# Patient Record
Sex: Female | Born: 1985 | Race: White | Hispanic: No | Marital: Single
Health system: Southern US, Academic
[De-identification: ages and names within clinical notes are randomized; demographics above are authoritative.]

## PROBLEM LIST (undated history)

## (undated) ENCOUNTER — Telehealth

## (undated) ENCOUNTER — Telehealth
Attending: Student in an Organized Health Care Education/Training Program | Primary: Student in an Organized Health Care Education/Training Program

## (undated) ENCOUNTER — Telehealth: Attending: Psychiatry | Primary: Psychiatry

## (undated) ENCOUNTER — Encounter

## (undated) ENCOUNTER — Encounter: Attending: Psychiatry | Primary: Psychiatry

## (undated) ENCOUNTER — Ambulatory Visit: Payer: MEDICAID

## (undated) ENCOUNTER — Encounter
Attending: Student in an Organized Health Care Education/Training Program | Primary: Student in an Organized Health Care Education/Training Program

## (undated) ENCOUNTER — Ambulatory Visit
Payer: MEDICAID | Attending: Student in an Organized Health Care Education/Training Program | Primary: Student in an Organized Health Care Education/Training Program

## (undated) ENCOUNTER — Ambulatory Visit

## (undated) ENCOUNTER — Ambulatory Visit: Payer: MEDICAID | Attending: Clinical Neuropsychologist | Primary: Clinical Neuropsychologist

## (undated) ENCOUNTER — Encounter: Attending: Family | Primary: Family

## (undated) ENCOUNTER — Ambulatory Visit: Payer: MEDICAID | Attending: Obstetrics & Gynecology | Primary: Obstetrics & Gynecology

## (undated) ENCOUNTER — Telehealth: Attending: Clinical Neuropsychologist | Primary: Clinical Neuropsychologist

## (undated) ENCOUNTER — Ambulatory Visit: Payer: MEDICAID | Attending: Internal Medicine | Primary: Internal Medicine

## (undated) ENCOUNTER — Ambulatory Visit: Payer: MEDICAID | Attending: Family | Primary: Family

## (undated) ENCOUNTER — Telehealth: Attending: Internal Medicine | Primary: Internal Medicine

## (undated) ENCOUNTER — Encounter
Attending: Pharmacist Clinician (PhC)/ Clinical Pharmacy Specialist | Primary: Pharmacist Clinician (PhC)/ Clinical Pharmacy Specialist

## (undated) ENCOUNTER — Ambulatory Visit
Attending: Pharmacist Clinician (PhC)/ Clinical Pharmacy Specialist | Primary: Pharmacist Clinician (PhC)/ Clinical Pharmacy Specialist

## (undated) ENCOUNTER — Encounter: Payer: MEDICAID | Attending: Gastroenterology | Primary: Gastroenterology

## (undated) ENCOUNTER — Ambulatory Visit: Payer: MEDICAID | Attending: Nurse Practitioner | Primary: Nurse Practitioner

## (undated) ENCOUNTER — Ambulatory Visit: Attending: Social Worker | Primary: Social Worker

## (undated) ENCOUNTER — Ambulatory Visit: Payer: MEDICAID | Attending: Physician Assistant | Primary: Physician Assistant

---
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
## 2009-01-09 ENCOUNTER — Emergency Department (HOSPITAL_COMMUNITY): Admission: EM | Admit: 2009-01-09 | Discharge: 2009-01-09 | Payer: Self-pay | Admitting: Emergency Medicine

## 2010-01-08 ENCOUNTER — Emergency Department (HOSPITAL_COMMUNITY): Admission: EM | Admit: 2010-01-08 | Discharge: 2010-01-08 | Payer: Self-pay | Admitting: Emergency Medicine

## 2010-01-10 ENCOUNTER — Inpatient Hospital Stay (HOSPITAL_COMMUNITY): Admission: AD | Admit: 2010-01-10 | Discharge: 2010-01-10 | Payer: Self-pay | Admitting: Obstetrics & Gynecology

## 2010-01-14 ENCOUNTER — Inpatient Hospital Stay (HOSPITAL_COMMUNITY): Admission: AD | Admit: 2010-01-14 | Discharge: 2010-01-14 | Payer: Self-pay | Admitting: Family Medicine

## 2010-01-14 IMAGING — US US OB TRANSVAGINAL
1 series · 14 of 24 positions shown · non-contrast
Comparison: none

OBSTETRICAL ULTRASOUND:
 This ultrasound exam was performed in the [HOSPITAL] Ultrasound Department.  The OB US report was generated in the AS system, and faxed to the ordering physician.  This report is also available in [HOSPITAL]?s AccessANYware and in [REDACTED] PACS.

[Series 1: us ob transvaginal · 0.13mm/px · 24 acquisitions, 14 frames shown]
[im 1/24]
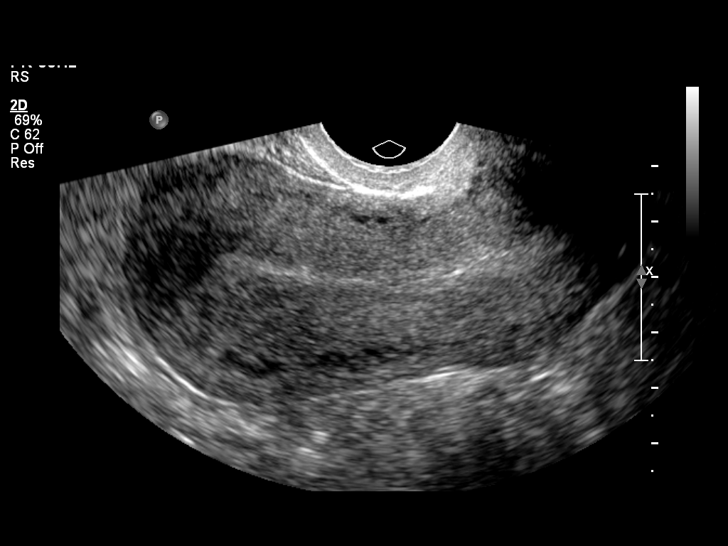
[im 3/24]
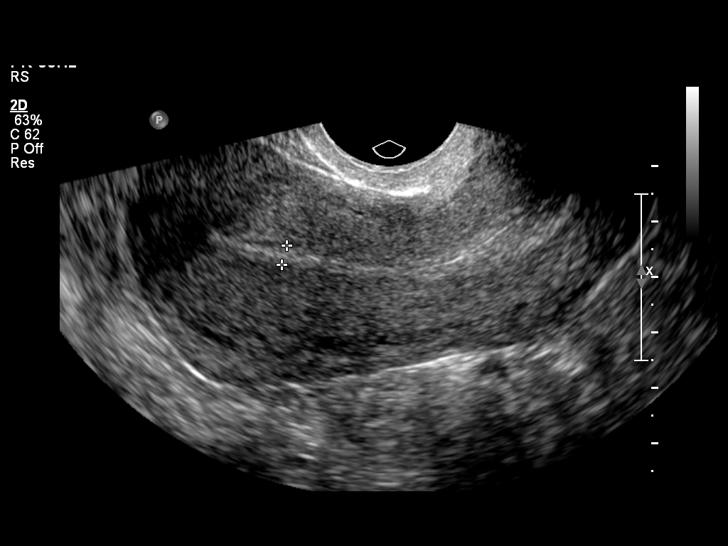
[im 5/24]
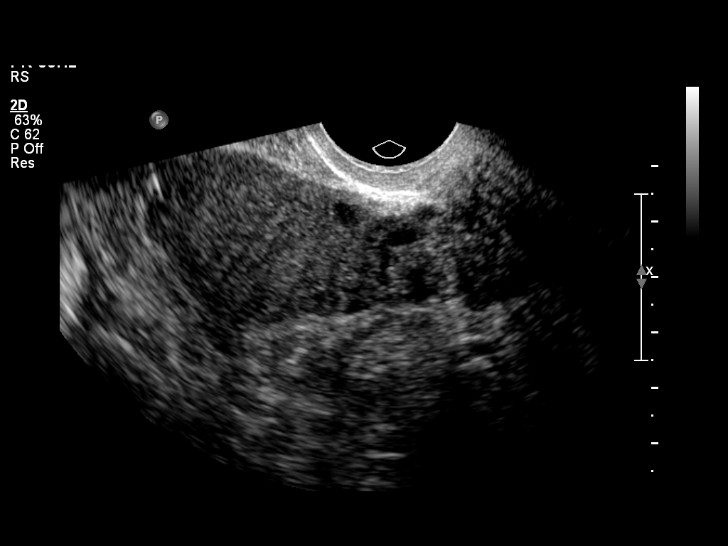
[im 7/24]
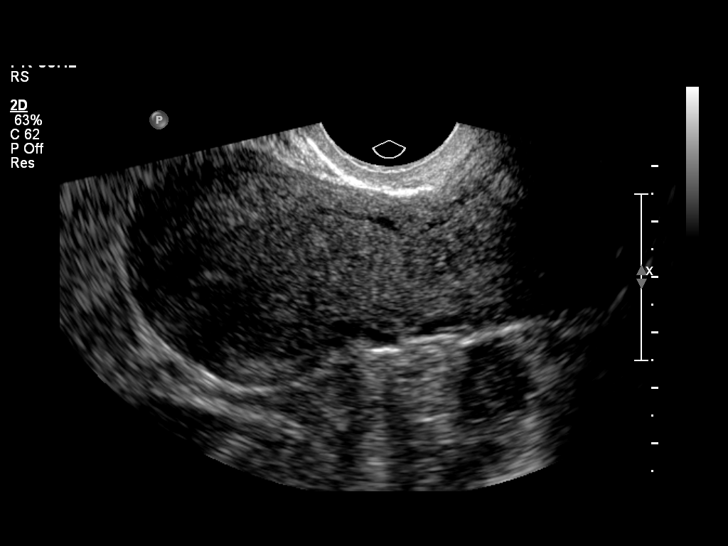
[im 8/24]
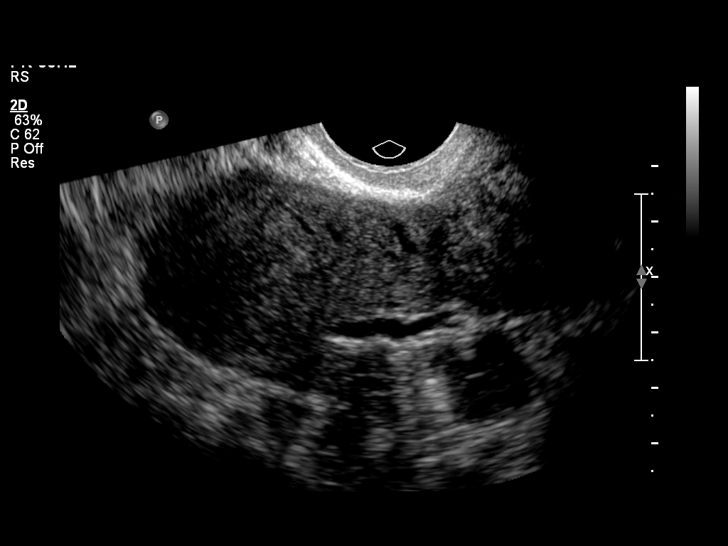
[im 10/24]
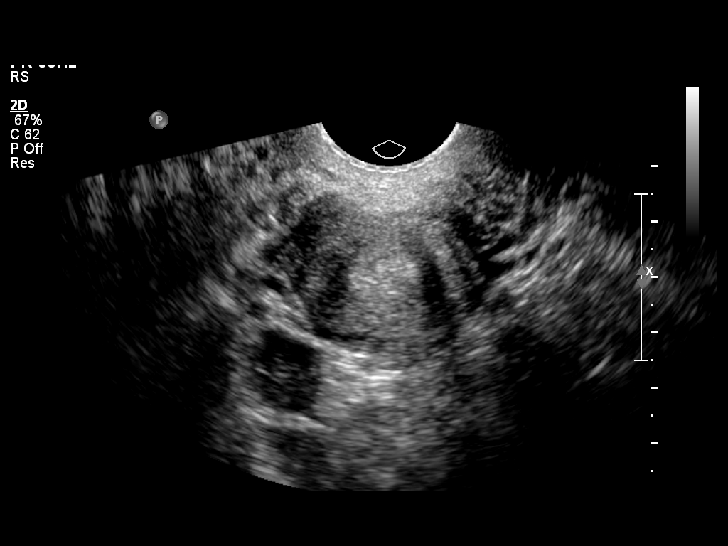
[im 12/24]
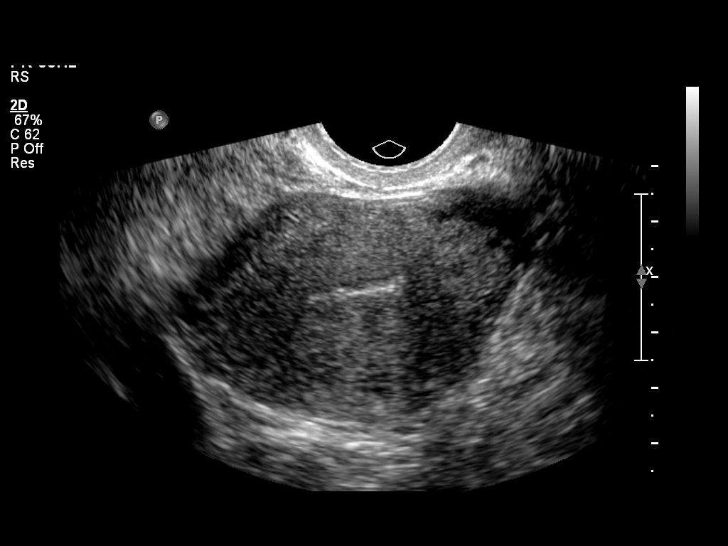
[im 13/24]
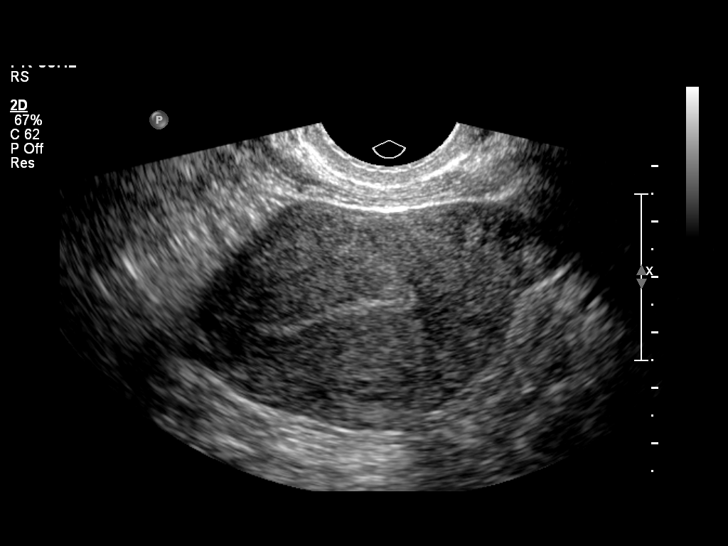
[im 15/24]
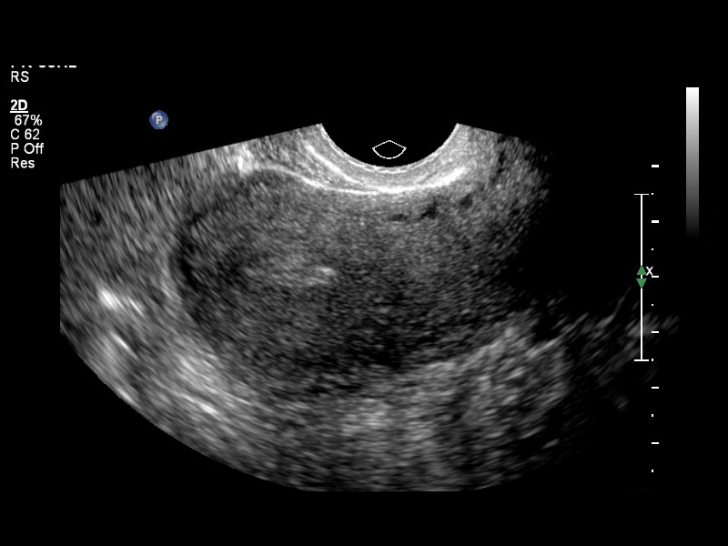
[im 17/24]
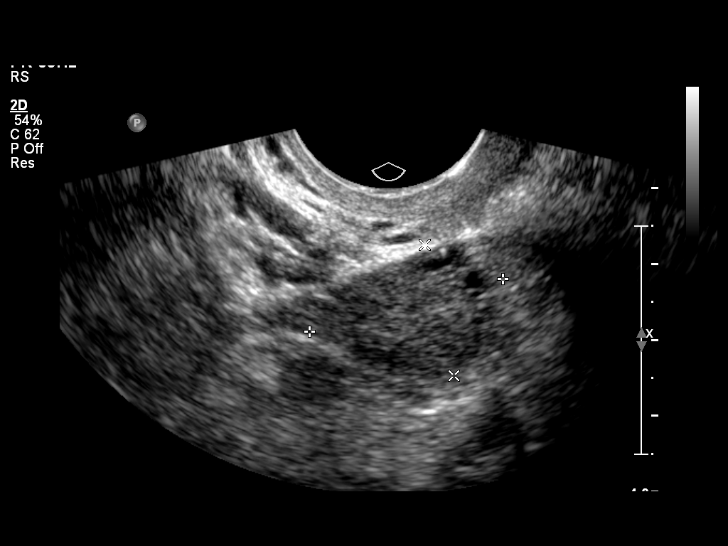
[im 19/24]
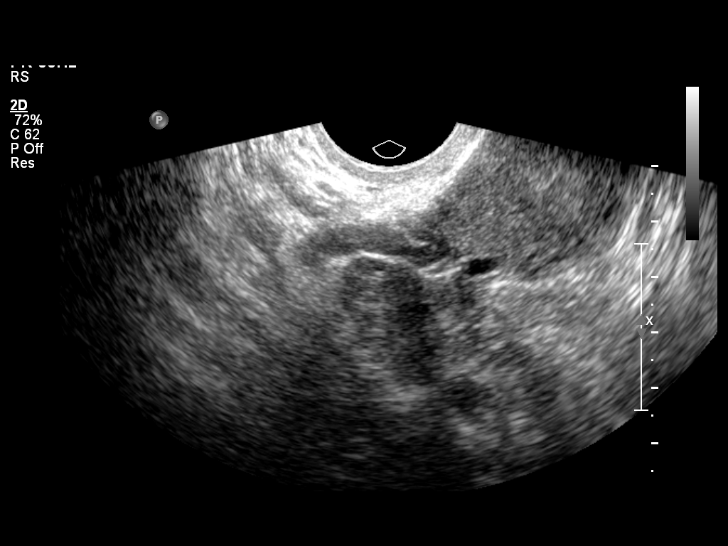
[im 20/24]
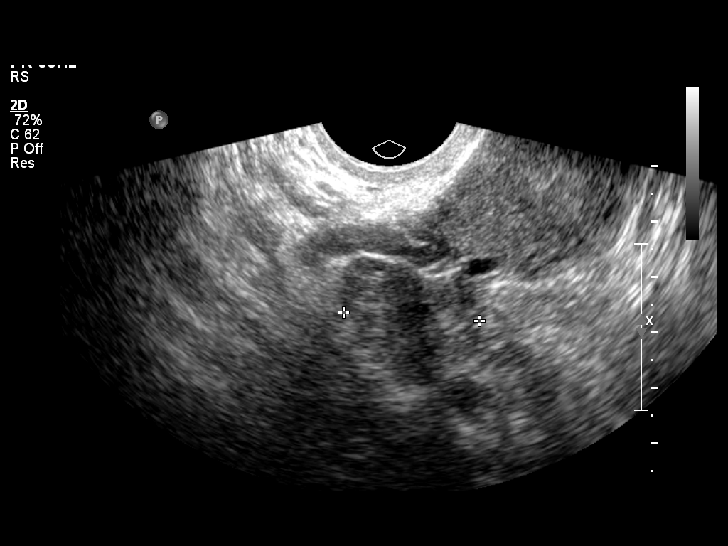
[im 22/24]
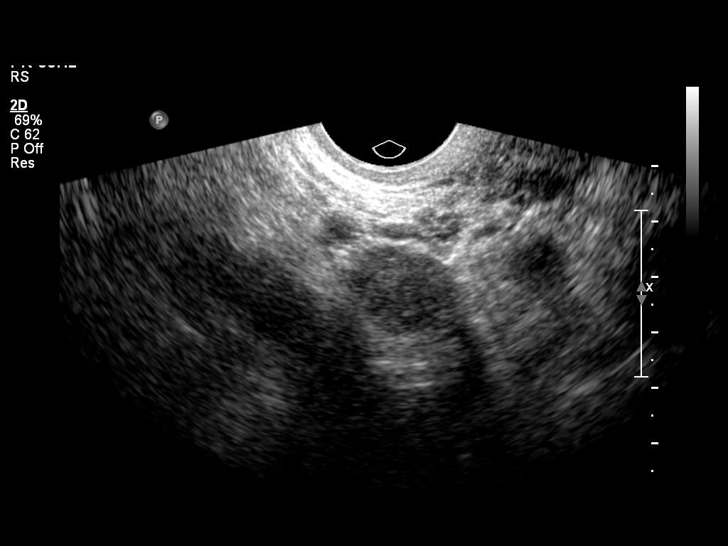
[im 24/24]
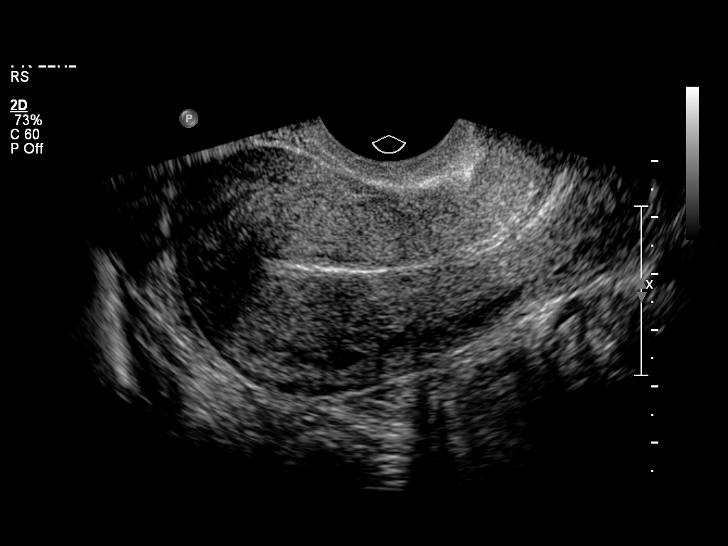

[14 of 24 positions shown; findings below may reference images not displayed]

IMPRESSION: See AS Obstetric US report.

## 2010-11-09 ENCOUNTER — Encounter: Payer: Self-pay | Admitting: Obstetrics & Gynecology

## 2011-01-12 LAB — CBC
HCT: 43.3 % (ref 36.0–46.0)
Hemoglobin: 14.6 g/dL (ref 12.0–15.0)
Platelets: 300 10*3/uL (ref 150–400)
RBC: 4.31 MIL/uL (ref 3.87–5.11)
RDW: 12.2 % (ref 11.5–15.5)
WBC: 8.6 10*3/uL (ref 4.0–10.5)

## 2011-01-12 LAB — POCT I-STAT, CHEM 8
Calcium, Ion: 1.22 mmol/L (ref 1.12–1.32)
Chloride: 108 mEq/L (ref 96–112)
Glucose, Bld: 96 mg/dL (ref 70–99)
Hemoglobin: 14.6 g/dL (ref 12.0–15.0)
Potassium: 4.1 mEq/L (ref 3.5–5.1)
TCO2: 23 mmol/L (ref 0–100)

## 2011-01-12 LAB — URINALYSIS, ROUTINE W REFLEX MICROSCOPIC
Bilirubin Urine: NEGATIVE
Glucose, UA: NEGATIVE mg/dL
Ketones, ur: NEGATIVE mg/dL
Protein, ur: NEGATIVE mg/dL
Urobilinogen, UA: 1 mg/dL (ref 0.0–1.0)
pH: 7.5 (ref 5.0–8.0)

## 2011-01-12 LAB — WET PREP, GENITAL: Trich, Wet Prep: NONE SEEN

## 2011-01-12 LAB — GC/CHLAMYDIA PROBE AMP, GENITAL
Chlamydia, DNA Probe: NEGATIVE
GC Probe Amp, Genital: NEGATIVE

## 2011-01-12 LAB — ABO/RH: ABO/RH(D): A POS

## 2017-04-29 ENCOUNTER — Ambulatory Visit
Admission: RE | Admit: 2017-04-29 | Discharge: 2017-04-29 | Disposition: A | Discharge status: Home / Self Care | Payer: MEDICAID | Attending: Family | Admitting: Family

## 2017-04-29 DIAGNOSIS — F319 Bipolar disorder, unspecified: Secondary | ICD-10-CM

## 2017-04-29 DIAGNOSIS — O99320 Drug use complicating pregnancy, unspecified trimester: Principal | ICD-10-CM

## 2017-04-29 DIAGNOSIS — F4310 Post-traumatic stress disorder, unspecified: Secondary | ICD-10-CM

## 2017-04-29 MED ORDER — BUPRENORPHINE 8 MG-NALOXONE 2 MG SUBLINGUAL FILM: 1 | Film | Freq: Three times a day (TID) | 0 refills | 0 days | Status: AC

## 2017-04-29 MED ORDER — METRONIDAZOLE 0.75 % VAGINAL GEL
Freq: Two times a day (BID) | VAGINAL | 2 refills | 0.00000 days | Status: CP
Start: 2017-04-29 — End: 2017-04-29

## 2017-04-29 MED ORDER — METRONIDAZOLE 0.75 % VAGINAL GEL: g | Freq: Two times a day (BID) | 2 refills | 0 days | Status: AC

## 2017-04-29 MED ORDER — TERCONAZOLE 0.4 % VAGINAL CREAM
Freq: Every evening | VAGINAL | 0 refills | 0.00000 days | Status: CP
Start: 2017-04-29 — End: 2017-05-06

## 2017-04-29 MED ORDER — TERCONAZOLE 0.4 % VAGINAL CREAM: 1 | g | Freq: Every evening | 0 refills | 0 days | Status: AC

## 2017-04-29 MED ORDER — BUPRENORPHINE 8 MG-NALOXONE 2 MG SUBLINGUAL FILM
ORAL_FILM | Freq: Three times a day (TID) | SUBLINGUAL | 0 refills | 0.00000 days | Status: CP
Start: 2017-04-29 — End: 2017-05-13

## 2017-05-13 MED ORDER — BUPRENORPHINE 8 MG-NALOXONE 2 MG SUBLINGUAL FILM
ORAL_FILM | Freq: Three times a day (TID) | SUBLINGUAL | 0 refills | 0.00000 days | Status: CP
Start: 2017-05-13 — End: 2017-05-27

## 2017-05-27 MED ORDER — BUPRENORPHINE 8 MG-NALOXONE 2 MG SUBLINGUAL FILM
ORAL_FILM | Freq: Three times a day (TID) | SUBLINGUAL | 0 refills | 0.00000 days | Status: CP
Start: 2017-05-27 — End: 2017-06-10

## 2017-06-11 MED ORDER — BUPRENORPHINE 8 MG-NALOXONE 2 MG SUBLINGUAL FILM
ORAL_FILM | Freq: Three times a day (TID) | SUBLINGUAL | 0 refills | 0.00000 days | Status: CP
Start: 2017-06-11 — End: 2017-06-24

## 2017-06-17 ENCOUNTER — Ambulatory Visit: Admission: RE | Admit: 2017-06-17 | Discharge: 2017-06-17 | Payer: MEDICAID

## 2017-06-17 DIAGNOSIS — F331 Major depressive disorder, recurrent, moderate: Principal | ICD-10-CM

## 2017-06-17 DIAGNOSIS — F1121 Opioid dependence, in remission: Secondary | ICD-10-CM

## 2017-06-17 MED ORDER — MIRTAZAPINE 15 MG TABLET
ORAL_TABLET | Freq: Every evening | ORAL | 0 refills | 0 days | Status: CP
Start: 2017-06-17 — End: 2017-07-08

## 2017-06-17 MED ORDER — LAMOTRIGINE 25 MG TABLET
ORAL_TABLET | 2 refills | 0 days | Status: CP
Start: 2017-06-17 — End: 2017-07-08

## 2017-06-24 MED ORDER — BUPRENORPHINE 8 MG-NALOXONE 2 MG SUBLINGUAL FILM
ORAL_FILM | Freq: Three times a day (TID) | SUBLINGUAL | 0 refills | 0.00000 days | Status: CP
Start: 2017-06-24 — End: 2017-07-08

## 2017-07-08 ENCOUNTER — Ambulatory Visit: Admission: RE | Admit: 2017-07-08 | Discharge: 2017-07-08 | Payer: MEDICAID

## 2017-07-08 DIAGNOSIS — F331 Major depressive disorder, recurrent, moderate: Principal | ICD-10-CM

## 2017-07-08 DIAGNOSIS — F1121 Opioid dependence, in remission: Secondary | ICD-10-CM

## 2017-07-08 MED ORDER — BUPRENORPHINE 8 MG-NALOXONE 2 MG SUBLINGUAL FILM
ORAL_FILM | Freq: Three times a day (TID) | SUBLINGUAL | 0 refills | 0.00000 days | Status: CP
Start: 2017-07-08 — End: 2017-07-22

## 2017-07-08 MED ORDER — PRAZOSIN 1 MG CAPSULE
ORAL_CAPSULE | 0 refills | 0 days | Status: CP
Start: 2017-07-08 — End: 2017-09-17

## 2017-07-08 MED ORDER — LAMOTRIGINE 100 MG TABLET: 100 mg | tablet | Freq: Every day | 1 refills | 0 days | Status: AC

## 2017-07-08 MED ORDER — LAMOTRIGINE 100 MG TABLET: 100 mg | tablet | Freq: Every day | 0 refills | 0 days | Status: AC

## 2017-07-08 MED ORDER — LAMOTRIGINE 100 MG TABLET
ORAL_TABLET | Freq: Every day | ORAL | 0 refills | 0.00000 days | Status: CP
Start: 2017-07-08 — End: 2017-08-22

## 2017-07-08 MED ORDER — FLUOXETINE 10 MG CAPSULE
ORAL_CAPSULE | 0 refills | 0 days | Status: CP
Start: 2017-07-08 — End: 2017-08-22

## 2017-07-22 MED ORDER — BUPRENORPHINE 8 MG-NALOXONE 2 MG SUBLINGUAL FILM
ORAL_FILM | Freq: Three times a day (TID) | SUBLINGUAL | 0 refills | 0.00000 days | Status: CP
Start: 2017-07-22 — End: 2017-08-05

## 2017-08-05 MED ORDER — BUPRENORPHINE 8 MG-NALOXONE 2 MG SUBLINGUAL FILM
ORAL_FILM | Freq: Three times a day (TID) | SUBLINGUAL | 0 refills | 0 days | Status: CP
Start: 2017-08-05 — End: 2017-08-20

## 2017-08-20 MED ORDER — BUPRENORPHINE 8 MG-NALOXONE 2 MG SUBLINGUAL FILM
ORAL_FILM | Freq: Three times a day (TID) | SUBLINGUAL | 0 refills | 0 days | Status: CP
Start: 2017-08-20 — End: 2017-08-23

## 2017-08-22 MED ORDER — LAMOTRIGINE 100 MG TABLET
ORAL_TABLET | Freq: Every day | ORAL | 0 refills | 0 days | Status: CP
Start: 2017-08-22 — End: 2017-09-17

## 2017-08-22 MED ORDER — FLUOXETINE 20 MG CAPSULE
ORAL_CAPSULE | Freq: Every day | ORAL | 0 refills | 0.00000 days | Status: CP
Start: 2017-08-22 — End: 2017-09-17

## 2017-08-23 MED ORDER — BUPRENORPHINE 8 MG-NALOXONE 2 MG SUBLINGUAL FILM
ORAL_FILM | Freq: Three times a day (TID) | SUBLINGUAL | 0 refills | 0 days | Status: CP
Start: 2017-08-23 — End: 2017-08-26

## 2017-08-27 MED ORDER — BUPRENORPHINE 8 MG-NALOXONE 2 MG SUBLINGUAL FILM
ORAL_FILM | Freq: Three times a day (TID) | SUBLINGUAL | 0 refills | 0.00000 days | Status: CP
Start: 2017-08-27 — End: 2017-08-31

## 2017-08-31 MED ORDER — BUPRENORPHINE 8 MG-NALOXONE 2 MG SUBLINGUAL FILM
ORAL_FILM | Freq: Three times a day (TID) | SUBLINGUAL | 0 refills | 0.00000 days | Status: CP
Start: 2017-08-31 — End: 2017-09-13

## 2017-09-14 MED ORDER — BUPRENORPHINE 8 MG-NALOXONE 2 MG SUBLINGUAL FILM
ORAL_FILM | Freq: Three times a day (TID) | SUBLINGUAL | 0 refills | 0 days | Status: CP
Start: 2017-09-14 — End: 2017-09-27

## 2017-09-17 MED ORDER — LAMOTRIGINE 100 MG TABLET
ORAL_TABLET | Freq: Every day | ORAL | 0 refills | 0.00000 days | Status: CP
Start: 2017-09-17 — End: 2017-11-01

## 2017-09-17 MED ORDER — FLUOXETINE 20 MG CAPSULE
ORAL_CAPSULE | Freq: Every day | ORAL | 0 refills | 0 days | Status: CP
Start: 2017-09-17 — End: 2017-11-01

## 2017-09-17 MED ORDER — PRENATAL VITAMIN WITH CALCIUM NO.72-IRON 27 MG-FOLIC ACID 1 MG TABLET
ORAL_TABLET | Freq: Every day | ORAL | 0 refills | 0 days | Status: CP
Start: 2017-09-17 — End: 2018-02-08

## 2017-09-17 MED ORDER — QUETIAPINE 25 MG TABLET
ORAL_TABLET | Freq: Every evening | ORAL | 0 refills | 0.00000 days | Status: CP
Start: 2017-09-17 — End: 2017-11-01

## 2017-09-17 MED ORDER — PRAZOSIN 1 MG CAPSULE
ORAL_CAPSULE | 0 refills | 0 days | Status: CP
Start: 2017-09-17 — End: 2017-11-01

## 2017-09-27 MED ORDER — BUPRENORPHINE 8 MG-NALOXONE 2 MG SUBLINGUAL FILM
ORAL_FILM | Freq: Three times a day (TID) | SUBLINGUAL | 0 refills | 0.00000 days | Status: CP
Start: 2017-09-27 — End: 2017-10-04

## 2017-10-04 MED ORDER — BUPRENORPHINE 8 MG-NALOXONE 2 MG SUBLINGUAL FILM
ORAL_FILM | Freq: Three times a day (TID) | SUBLINGUAL | 0 refills | 0.00000 days
Start: 2017-10-04 — End: 2017-10-07

## 2017-10-14 MED ORDER — BUPRENORPHINE 8 MG-NALOXONE 2 MG SUBLINGUAL FILM
ORAL_FILM | Freq: Three times a day (TID) | SUBLINGUAL | 0 refills | 0 days
Start: 2017-10-14 — End: 2017-10-25

## 2017-10-25 MED ORDER — BUPRENORPHINE 8 MG-NALOXONE 2 MG SUBLINGUAL FILM
ORAL_FILM | Freq: Three times a day (TID) | SUBLINGUAL | 0 refills | 0.00000 days
Start: 2017-10-25 — End: 2017-11-01

## 2017-11-01 MED ORDER — PRAZOSIN 1 MG CAPSULE
ORAL_CAPSULE | Freq: Every evening | ORAL | 0 refills | 0 days | Status: CP
Start: 2017-11-01 — End: 2017-11-22

## 2017-11-01 MED ORDER — BUPRENORPHINE 8 MG-NALOXONE 2 MG SUBLINGUAL FILM
ORAL_FILM | Freq: Three times a day (TID) | SUBLINGUAL | 0 refills | 0 days | Status: CP
Start: 2017-11-01 — End: 2017-11-05

## 2017-11-01 MED ORDER — QUETIAPINE 25 MG TABLET
ORAL_TABLET | Freq: Every evening | ORAL | 0 refills | 0.00000 days | Status: CP
Start: 2017-11-01 — End: 2017-11-22

## 2017-11-01 MED ORDER — MIRTAZAPINE 15 MG TABLET
ORAL_TABLET | Freq: Every evening | ORAL | 0 refills | 0 days | Status: CP
Start: 2017-11-01 — End: 2017-11-22

## 2017-11-01 MED ORDER — FLUOXETINE 20 MG CAPSULE
ORAL_CAPSULE | Freq: Every day | ORAL | 0 refills | 0 days | Status: CP
Start: 2017-11-01 — End: 2017-11-22

## 2017-11-01 MED ORDER — LAMOTRIGINE 100 MG TABLET
ORAL_TABLET | Freq: Every day | ORAL | 0 refills | 0.00000 days | Status: CP
Start: 2017-11-01 — End: 2017-11-22

## 2017-11-08 MED ORDER — BUPRENORPHINE 8 MG-NALOXONE 2 MG SUBLINGUAL FILM
ORAL_FILM | Freq: Three times a day (TID) | SUBLINGUAL | 0 refills | 0.00000 days | Status: CP
Start: 2017-11-08 — End: 2017-11-15

## 2017-11-15 MED ORDER — BUPRENORPHINE 8 MG-NALOXONE 2 MG SUBLINGUAL FILM
ORAL_FILM | Freq: Three times a day (TID) | SUBLINGUAL | 0 refills | 0 days
Start: 2017-11-15 — End: 2017-11-22

## 2017-11-22 MED ORDER — PRAZOSIN 1 MG CAPSULE
ORAL_CAPSULE | Freq: Every evening | ORAL | 0 refills | 0.00000 days | Status: CP
Start: 2017-11-22 — End: 2017-12-06

## 2017-11-22 MED ORDER — BUPRENORPHINE 8 MG-NALOXONE 2 MG SUBLINGUAL FILM
ORAL_FILM | Freq: Three times a day (TID) | SUBLINGUAL | 0 refills | 0 days | Status: CP
Start: 2017-11-22 — End: 2017-11-29

## 2017-11-22 MED ORDER — FLUOXETINE 20 MG CAPSULE
ORAL_CAPSULE | Freq: Every day | ORAL | 0 refills | 0 days | Status: CP
Start: 2017-11-22 — End: 2017-12-06

## 2017-11-22 MED ORDER — QUETIAPINE 25 MG TABLET
ORAL_TABLET | Freq: Every evening | ORAL | 0 refills | 0 days | Status: CP
Start: 2017-11-22 — End: 2017-12-06

## 2017-11-22 MED ORDER — LAMOTRIGINE 100 MG TABLET
ORAL_TABLET | Freq: Every day | ORAL | 0 refills | 0 days | Status: CP
Start: 2017-11-22 — End: 2017-12-06

## 2017-11-22 MED ORDER — MIRTAZAPINE 15 MG TABLET
ORAL_TABLET | Freq: Every evening | ORAL | 0 refills | 0.00000 days | Status: CP
Start: 2017-11-22 — End: 2017-12-06

## 2017-11-29 MED ORDER — BUPRENORPHINE 8 MG-NALOXONE 2 MG SUBLINGUAL FILM
ORAL_FILM | Freq: Three times a day (TID) | SUBLINGUAL | 0 refills | 0.00000 days
Start: 2017-11-29 — End: 2017-12-06

## 2017-12-06 MED ORDER — MIRTAZAPINE 15 MG TABLET
ORAL_TABLET | Freq: Every evening | ORAL | 0 refills | 0.00000 days | Status: CP
Start: 2017-12-06 — End: 2018-02-08

## 2017-12-06 MED ORDER — QUETIAPINE 25 MG TABLET
ORAL_TABLET | Freq: Every evening | ORAL | 0 refills | 0.00000 days | Status: CP
Start: 2017-12-06 — End: 2018-02-08

## 2017-12-06 MED ORDER — BUPRENORPHINE 8 MG-NALOXONE 2 MG SUBLINGUAL FILM
ORAL_FILM | Freq: Three times a day (TID) | SUBLINGUAL | 0 refills | 0 days | Status: CP
Start: 2017-12-06 — End: 2017-12-20

## 2017-12-06 MED ORDER — LAMOTRIGINE 100 MG TABLET
ORAL_TABLET | Freq: Every day | ORAL | 0 refills | 0 days | Status: CP
Start: 2017-12-06 — End: 2018-02-08

## 2017-12-06 MED ORDER — FLUOXETINE 20 MG CAPSULE
ORAL_CAPSULE | Freq: Every day | ORAL | 0 refills | 0.00000 days | Status: CP
Start: 2017-12-06 — End: 2018-02-08

## 2017-12-20 MED ORDER — BUPRENORPHINE 8 MG-NALOXONE 2 MG SUBLINGUAL FILM
ORAL_FILM | Freq: Three times a day (TID) | SUBLINGUAL | 0 refills | 0 days
Start: 2017-12-20 — End: 2017-12-27

## 2017-12-27 MED ORDER — BUPRENORPHINE 8 MG-NALOXONE 2 MG SUBLINGUAL FILM
ORAL_FILM | Freq: Three times a day (TID) | SUBLINGUAL | 0 refills | 0 days
Start: 2017-12-27 — End: 2018-01-03

## 2018-01-03 MED ORDER — BUPRENORPHINE 8 MG-NALOXONE 2 MG SUBLINGUAL FILM
ORAL_FILM | Freq: Three times a day (TID) | SUBLINGUAL | 0 refills | 0.00000 days
Start: 2018-01-03 — End: 2018-01-13

## 2018-01-20 MED ORDER — BUPRENORPHINE 8 MG-NALOXONE 2 MG SUBLINGUAL FILM
ORAL_FILM | Freq: Three times a day (TID) | SUBLINGUAL | 0 refills | 0 days
Start: 2018-01-20 — End: 2018-01-27

## 2018-01-27 MED ORDER — BUPRENORPHINE 8 MG-NALOXONE 2 MG SUBLINGUAL FILM
ORAL_FILM | Freq: Three times a day (TID) | SUBLINGUAL | 0 refills | 0 days
Start: 2018-01-27 — End: 2018-02-07

## 2018-02-07 MED ORDER — BUPRENORPHINE 8 MG-NALOXONE 2 MG SUBLINGUAL FILM
ORAL_FILM | Freq: Three times a day (TID) | SUBLINGUAL | 0 refills | 0 days
Start: 2018-02-07 — End: 2018-02-14

## 2018-02-08 MED ORDER — QUETIAPINE 25 MG TABLET
ORAL_TABLET | Freq: Every evening | ORAL | 0 refills | 0.00000 days | Status: CP
Start: 2018-02-08 — End: 2018-03-17

## 2018-02-08 MED ORDER — FLUOXETINE 20 MG CAPSULE
ORAL_CAPSULE | Freq: Every day | ORAL | 0 refills | 0.00000 days | Status: CP
Start: 2018-02-08 — End: 2018-03-17

## 2018-02-08 MED ORDER — LAMOTRIGINE 100 MG TABLET
ORAL_TABLET | Freq: Every day | ORAL | 0 refills | 0.00000 days | Status: CP
Start: 2018-02-08 — End: 2018-03-17

## 2018-02-08 MED ORDER — PRENATAL VITAMIN WITH CALCIUM NO.72-IRON 27 MG-FOLIC ACID 1 MG TABLET
ORAL_TABLET | Freq: Every day | ORAL | 0 refills | 0 days | Status: CP
Start: 2018-02-08 — End: ?

## 2018-02-08 MED ORDER — MIRTAZAPINE 15 MG TABLET
ORAL_TABLET | Freq: Every evening | ORAL | 0 refills | 0.00000 days | Status: CP
Start: 2018-02-08 — End: 2018-03-17

## 2018-02-09 ENCOUNTER — Ambulatory Visit: Admit: 2018-02-09 | Discharge: 2018-02-10 | Payer: MEDICAID

## 2018-02-09 DIAGNOSIS — N898 Other specified noninflammatory disorders of vagina: Principal | ICD-10-CM

## 2018-02-09 DIAGNOSIS — N739 Female pelvic inflammatory disease, unspecified: Secondary | ICD-10-CM

## 2018-02-09 DIAGNOSIS — R87610 Atypical squamous cells of undetermined significance on cytologic smear of cervix (ASC-US): Secondary | ICD-10-CM

## 2018-02-09 DIAGNOSIS — Z309 Encounter for contraceptive management, unspecified: Secondary | ICD-10-CM

## 2018-02-09 MED ORDER — METRONIDAZOLE 1 % TOPICAL GEL
Freq: Every day | TOPICAL | 10 refills | 0 days | Status: CP
Start: 2018-02-09 — End: 2018-03-23

## 2018-02-09 MED ORDER — DOXYCYCLINE HYCLATE 100 MG CAPSULE
ORAL_CAPSULE | Freq: Two times a day (BID) | ORAL | 0 refills | 0.00000 days | Status: CP
Start: 2018-02-09 — End: 2018-02-19

## 2018-02-10 MED ORDER — NITROFURANTOIN MONOHYDRATE/MACROCRYSTALS 100 MG CAPSULE
ORAL_CAPSULE | Freq: Two times a day (BID) | ORAL | 0 refills | 0 days | Status: CP
Start: 2018-02-10 — End: 2018-02-17

## 2018-02-14 MED ORDER — BUPRENORPHINE 8 MG-NALOXONE 2 MG SUBLINGUAL FILM
ORAL_FILM | Freq: Three times a day (TID) | SUBLINGUAL | 0 refills | 0 days
Start: 2018-02-14 — End: 2018-02-21

## 2018-02-21 MED ORDER — BUPRENORPHINE 8 MG-NALOXONE 2 MG SUBLINGUAL FILM
ORAL_FILM | Freq: Three times a day (TID) | SUBLINGUAL | 0 refills | 0.00000 days
Start: 2018-02-21 — End: 2018-02-28

## 2018-03-02 MED ORDER — BUPRENORPHINE 8 MG-NALOXONE 2 MG SUBLINGUAL FILM
ORAL_FILM | Freq: Three times a day (TID) | SUBLINGUAL | 0 refills | 0.00000 days
Start: 2018-03-02 — End: 2018-03-17

## 2018-03-17 MED ORDER — BUPRENORPHINE 8 MG-NALOXONE 2 MG SUBLINGUAL FILM
ORAL_FILM | Freq: Three times a day (TID) | SUBLINGUAL | 0 refills | 0.00000 days | Status: CP
Start: 2018-03-17 — End: 2018-04-08

## 2018-03-17 MED ORDER — MIRTAZAPINE 15 MG TABLET
ORAL_TABLET | Freq: Every evening | ORAL | 0 refills | 0.00000 days | Status: CP
Start: 2018-03-17 — End: 2018-04-04

## 2018-03-17 MED ORDER — FLUOXETINE 20 MG CAPSULE
ORAL_CAPSULE | Freq: Every day | ORAL | 0 refills | 0.00000 days | Status: CP
Start: 2018-03-17 — End: 2018-04-04

## 2018-03-17 MED ORDER — QUETIAPINE 25 MG TABLET
ORAL_TABLET | Freq: Every evening | ORAL | 0 refills | 0.00000 days | Status: CP
Start: 2018-03-17 — End: 2018-06-06

## 2018-03-17 MED ORDER — LAMOTRIGINE 100 MG TABLET
ORAL_TABLET | Freq: Every day | ORAL | 0 refills | 0 days | Status: CP
Start: 2018-03-17 — End: 2018-04-04

## 2018-03-23 MED ORDER — METRONIDAZOLE 0.75 % VAGINAL GEL
Freq: Two times a day (BID) | VAGINAL | 0 refills | 0.00000 days | Status: CP
Start: 2018-03-23 — End: 2018-03-30

## 2018-03-30 ENCOUNTER — Ambulatory Visit: Admit: 2018-03-30 | Discharge: 2018-03-31 | Payer: MEDICAID

## 2018-03-30 DIAGNOSIS — Z309 Encounter for contraceptive management, unspecified: Principal | ICD-10-CM

## 2018-03-30 MED ORDER — METRONIDAZOLE 0.75 % VAGINAL GEL
Freq: Two times a day (BID) | VAGINAL | 0 refills | 0.00000 days | Status: CP
Start: 2018-03-30 — End: 2018-03-30

## 2018-03-30 MED ORDER — METRONIDAZOLE 0.75 % VAGINAL GEL: g | Freq: Two times a day (BID) | 12 refills | 0 days | Status: AC

## 2018-04-04 MED ORDER — VENLAFAXINE ER 75 MG CAPSULE,EXTENDED RELEASE 24 HR
ORAL_CAPSULE | Freq: Every day | ORAL | 0 refills | 0 days | Status: CP
Start: 2018-04-04 — End: 2018-05-11

## 2018-04-08 MED ORDER — BUPRENORPHINE 8 MG-NALOXONE 2 MG SUBLINGUAL FILM
ORAL_FILM | SUBLINGUAL | 0 refills | 0.00000 days
Start: 2018-04-08 — End: 2018-04-14

## 2018-04-11 ENCOUNTER — Ambulatory Visit: Admit: 2018-04-11 | Discharge: 2018-04-12 | Payer: MEDICAID | Attending: Pediatrics | Primary: Pediatrics

## 2018-04-11 DIAGNOSIS — J309 Allergic rhinitis, unspecified: Principal | ICD-10-CM

## 2018-04-11 MED ORDER — CETIRIZINE 10 MG TABLET
ORAL_TABLET | Freq: Every day | ORAL | 11 refills | 0 days | Status: CP
Start: 2018-04-11 — End: 2019-04-11

## 2018-04-14 MED ORDER — BUPRENORPHINE 8 MG-NALOXONE 2 MG SUBLINGUAL FILM
ORAL_FILM | SUBLINGUAL | 0 refills | 0 days | Status: CP
Start: 2018-04-14 — End: 2018-05-12

## 2018-04-16 MED ORDER — MIRTAZAPINE 15 MG TABLET
ORAL_TABLET | Freq: Every evening | ORAL | 0 refills | 0.00000 days | Status: CP
Start: 2018-04-16 — End: 2018-05-11

## 2018-04-16 MED ORDER — LAMOTRIGINE 100 MG TABLET
ORAL_TABLET | Freq: Every day | ORAL | 0 refills | 0.00000 days | Status: CP
Start: 2018-04-16 — End: 2018-05-11

## 2018-05-11 MED ORDER — LAMOTRIGINE 100 MG TABLET
ORAL_TABLET | Freq: Every day | ORAL | 0 refills | 0 days | Status: CP
Start: 2018-05-11 — End: 2018-06-06

## 2018-05-11 MED ORDER — VENLAFAXINE ER 75 MG CAPSULE,EXTENDED RELEASE 24 HR
ORAL_CAPSULE | Freq: Every day | ORAL | 0 refills | 0 days | Status: CP
Start: 2018-05-11 — End: 2018-06-06

## 2018-05-11 MED ORDER — MIRTAZAPINE 15 MG TABLET
ORAL_TABLET | Freq: Every evening | ORAL | 0 refills | 0.00000 days | Status: CP
Start: 2018-05-11 — End: 2018-06-06

## 2018-05-12 MED ORDER — BUPRENORPHINE 8 MG-NALOXONE 2 MG SUBLINGUAL FILM
ORAL_FILM | SUBLINGUAL | 0 refills | 0 days | Status: CP
Start: 2018-05-12 — End: 2018-06-06

## 2018-05-31 ENCOUNTER — Ambulatory Visit: Admit: 2018-05-31 | Discharge: 2018-06-01 | Payer: MEDICAID

## 2018-05-31 DIAGNOSIS — J40 Bronchitis, not specified as acute or chronic: Secondary | ICD-10-CM

## 2018-05-31 DIAGNOSIS — Z3200 Encounter for pregnancy test, result unknown: Principal | ICD-10-CM

## 2018-05-31 DIAGNOSIS — R635 Abnormal weight gain: Secondary | ICD-10-CM

## 2018-05-31 MED ORDER — AZITHROMYCIN 250 MG TABLET
ORAL_TABLET | 0 refills | 0 days | Status: CP
Start: 2018-05-31 — End: 2018-07-25

## 2018-06-06 MED ORDER — QUETIAPINE 25 MG TABLET
ORAL_TABLET | Freq: Every evening | ORAL | 0 refills | 0 days | Status: CP | PRN
Start: 2018-06-06 — End: 2018-07-04

## 2018-06-06 MED ORDER — BUPRENORPHINE 8 MG-NALOXONE 2 MG SUBLINGUAL FILM: 1 | Film | Freq: Two times a day (BID) | 0 refills | 0 days | Status: AC

## 2018-06-09 ENCOUNTER — Ambulatory Visit
Admit: 2018-06-09 | Discharge: 2018-06-10 | Payer: MEDICAID | Attending: Student in an Organized Health Care Education/Training Program | Primary: Student in an Organized Health Care Education/Training Program

## 2018-06-09 DIAGNOSIS — M545 Low back pain: Principal | ICD-10-CM

## 2018-06-09 DIAGNOSIS — B182 Chronic viral hepatitis C: Secondary | ICD-10-CM

## 2018-06-09 MED ORDER — VENLAFAXINE ER 75 MG CAPSULE,EXTENDED RELEASE 24 HR
ORAL_CAPSULE | Freq: Every day | ORAL | 0 refills | 0.00000 days | Status: CP
Start: 2018-06-09 — End: 2018-07-04

## 2018-06-09 MED ORDER — BUPRENORPHINE 8 MG-NALOXONE 2 MG SUBLINGUAL FILM
ORAL_FILM | SUBLINGUAL | 0 refills | 0.00000 days | Status: CP
Start: 2018-06-09 — End: 2018-06-06

## 2018-06-10 MED ORDER — LAMOTRIGINE 100 MG TABLET
ORAL_TABLET | Freq: Every day | ORAL | 0 refills | 0 days | Status: CP
Start: 2018-06-10 — End: 2018-07-04

## 2018-06-17 ENCOUNTER — Ambulatory Visit: Admit: 2018-06-17 | Discharge: 2018-06-18 | Payer: MEDICAID

## 2018-06-17 DIAGNOSIS — F419 Anxiety disorder, unspecified: Secondary | ICD-10-CM

## 2018-06-17 DIAGNOSIS — F329 Major depressive disorder, single episode, unspecified: Principal | ICD-10-CM

## 2018-06-17 DIAGNOSIS — Z5181 Encounter for therapeutic drug level monitoring: Secondary | ICD-10-CM

## 2018-06-17 DIAGNOSIS — G4700 Insomnia, unspecified: Secondary | ICD-10-CM

## 2018-06-17 DIAGNOSIS — F4310 Post-traumatic stress disorder, unspecified: Secondary | ICD-10-CM

## 2018-06-17 DIAGNOSIS — B182 Chronic viral hepatitis C: Secondary | ICD-10-CM

## 2018-06-17 DIAGNOSIS — F1120 Opioid dependence, uncomplicated: Secondary | ICD-10-CM

## 2018-07-04 MED ORDER — VENLAFAXINE ER 75 MG CAPSULE,EXTENDED RELEASE 24 HR
ORAL_CAPSULE | Freq: Every day | ORAL | 0 refills | 0.00000 days | Status: CP
Start: 2018-07-04 — End: 2018-07-25

## 2018-07-04 MED ORDER — LAMOTRIGINE 100 MG TABLET
ORAL_TABLET | Freq: Every day | ORAL | 0 refills | 0.00000 days | Status: CP
Start: 2018-07-04 — End: 2018-07-25

## 2018-07-04 MED ORDER — QUETIAPINE 25 MG TABLET
ORAL_TABLET | Freq: Every evening | ORAL | 0 refills | 0 days | Status: CP | PRN
Start: 2018-07-04 — End: 2018-07-25

## 2018-07-07 MED ORDER — BUPRENORPHINE 8 MG-NALOXONE 2 MG SUBLINGUAL FILM
ORAL_FILM | SUBLINGUAL | 0 refills | 0 days | Status: CP
Start: 2018-07-07 — End: 2018-08-04

## 2018-08-03 MED ORDER — BUPROPION HCL XL 150 MG 24 HR TABLET, EXTENDED RELEASE
ORAL_TABLET | Freq: Every day | ORAL | 0 refills | 0.00000 days | Status: CP
Start: 2018-08-03 — End: 2018-08-22

## 2018-08-03 MED ORDER — LAMOTRIGINE 100 MG TABLET
ORAL_TABLET | Freq: Every day | ORAL | 0 refills | 0 days | Status: CP
Start: 2018-08-03 — End: 2018-08-22

## 2018-08-03 MED ORDER — QUETIAPINE 25 MG TABLET
ORAL_TABLET | Freq: Every evening | ORAL | 0 refills | 0.00000 days | Status: CP | PRN
Start: 2018-08-03 — End: 2018-08-22

## 2018-08-03 MED ORDER — VENLAFAXINE ER 75 MG CAPSULE,EXTENDED RELEASE 24 HR
ORAL_CAPSULE | Freq: Every day | ORAL | 0 refills | 0.00000 days | Status: CP
Start: 2018-08-03 — End: 2018-08-22

## 2018-08-04 MED ORDER — BUPRENORPHINE 4 MG-NALOXONE 1 MG SUBLINGUAL FILM
ORAL_FILM | SUBLINGUAL | 0 refills | 0 days | Status: CP
Start: 2018-08-04 — End: 2018-08-22

## 2018-08-10 ENCOUNTER — Encounter: Admit: 2018-08-10 | Discharge: 2018-08-10 | Payer: MEDICAID | Attending: Gastroenterology | Primary: Gastroenterology

## 2018-08-10 ENCOUNTER — Ambulatory Visit: Admit: 2018-08-10 | Discharge: 2018-08-10 | Payer: MEDICAID | Attending: Gastroenterology | Primary: Gastroenterology

## 2018-08-10 DIAGNOSIS — B182 Chronic viral hepatitis C: Principal | ICD-10-CM

## 2018-08-10 LAB — PREGNANCY TEST URINE: Lab: NEGATIVE

## 2018-08-10 NOTE — Unmapped (Addendum)
[facility]  FAST ??? Fibrosis Assessment Team  Division of Gastroenterology and Hepatology  ??  ??    ??  FIBROSCAN will be performed to assess hepatic fibrosis (scarring) in order to stage this patient's liver disease. This will assist with evaluating the natural course of the disease and will provide important information regarding prognosis, duration of therapy, and potential response to treatment. This information will also help assess risk for hepatocellular carcinoma and need for liver cancer surveillance.??  ??  FibroscanProcedure:   After obtaining verbal consent, the patient was placed in a supine position. Physical characteristics and landmarks were assessed to establish appropriate mid-axillary intercostal space for probe placement. 50Hz  Shear Wave pulses were applied and the resulting Shear Wave and Propagation Speed detected with a 3.5 MHz ultrasonic signal, using the FibroScan probe.  Skin to liver capsule distance and liver parenchyma were accessed during the entire examination with the FibroScan probe. The patient was instructed to breathe normally and to abstain from sudden movements during the procedure, allowing for random measurements of liver stiffness. At least ten Shear Waves were produced; individual measurements of each Shear Wave were calculated. Patient tolerated the procedure well and was discharged without incident.  ??  Probe used [x]  M+    Serial # [id number]                         []  XL+   Serial # [id number]  ??  Main Etiology of Liver Disease:  [x] HCV     [] HBV   [] Alcohol    [] NASH  [] PBC      [] PSC     [] Other________________  ??  50Hz  shear wave pulses were applied and the resulting shear wave and propagation speed detected with a 3.5 MHz ultrasonic signal, using the FibroScan probe.     ??  At least ten Shear Waves were produced; individual measurements of each shear wave were calculated.    ??  Patient tolerated the procedure well and was discharged without incident.  ??  Fibroscan score: ___5.4___kPa  ??  IQR:                       ___2___%  ??  Test performed by: Luiz Ochoa, RN  ??  [facility]  FAST ??? Fibrosis Assessment Team  Division of Gastroenterology and Hepatology  ??  ??    ??  ??  ??  Estimation of the stage of liver fibrosis (Metavir Score):  The results of the Liver Stiffness Score are consistent with the following liver fibrosis stage:  ??  ??  [x]  F0-F1             []  F2               []  F3               []  F4  ??  ??  GENERAL RECOMMENDATIONS ACCORDING TO THE STAGE OF LIVER FIBROSIS.  ??  F0-F1: No-minimal fibrosis. The risk of progression to advanced fibrosis and cirrhosis is low. If the cause of liver disease is not removed, a 1-2 yr follow-up study is recommended.   F2: Significant fibrosis. There is a moderate risk of progression to cirrhosis. If the cause of liver disease is not removed, a follow-up study in 12 months is recommended.  F3: Advanced (pre-cirrhotic stage). The risk of progression to cirrhosis is high. Imaging studies to rule out hepatocellular carcinoma  should be considered. Efforts to remove the cause of liver disease are highly recommended.  F4: Cirrhosis. There is significant risk of portal hypertension and esophageal varices. An upper endoscopy is recommended. Imaging studies for hepatocellular carcinoma screening are recommended.   ??  Any and all FibroScan studies must be carefully evaluated, taking fully into account all individual measurement/scans, patient history and other factors.  As with liver biopsy, any estimation of liver fibrosis may be subject to under or over staging due to sampling error.  Any further medical or surgical intervention should be made only while fully considering the circumstances of this patient and in consultation with this patient.    I have reviewed and interpreted the FIBROSCAN test results as described above.

## 2018-08-10 NOTE — Unmapped (Signed)
[facility]    Alba Destine, M.D.  Professor of Medicine  Director, [facility]  [place] [facility] [place] [facility] [place]    [phone]    Wonda Cheng, [place]  [address] [place] [address]  [place], [place] [address]     Chief complaint: Patient is referred for consultation for chronic hepatitis C genotype 3    Present illness: Patient is a 32 y.o. Caucasian female with chronic hepatitis C diagnosed approximately 5 years ago.  Patient had a period of intravenous drug use.  She never had acute icteric hepatitis.  Patient was treated in the past with sofosbuvir and daclatasvir at [facility] in the past.  She took approximately 8 weeks of treatment but then was lost to follow-up and did not complete therapy.  Interestingly her HCVRNA was negative at the time of her last visit at week 8 of treatment.    Generally the patient feels well except for some fatigue.  She has been abstinent from alcohol and drugs and is living in a recovery home.  She denies nausea, vomiting, abdominal pain, chest pain, shortness of breath,.  She is had substantial weight gain since living at the [facility].    10 system review of systems is negative except as noted above.    Active Ambulatory Problems     Diagnosis Date Noted   ??? Chronic hepatitis C 01/30/2015   ??? PTSD (post-traumatic stress disorder) 08/01/2016   ??? Urticaria 08/01/2016   ??? Red blood cell antibody positive 08/20/2016   ??? 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 History of alcohol abuse 09/25/2016   ??? Asthma affecting pregnancy, antepartum 10/14/2016   ??? Pyelectasis of fetus on prenatal ultrasound 10/14/2016   ??? Alcohol use disorder, moderate, in early remission (CMS-HCC) 12/20/2017   ??? Use of nonprescription opiate drugs (CMS-HCC) 12/20/2017     Resolved Ambulatory Problems     Diagnosis Date Noted   ??? Oral thrush 01/30/2015   ??? Bipolar disorder/Depression 08/01/2016   ??? Less than [redacted] weeks gestation of pregnancy 08/01/2016   ??? Hypotension 08/01/2016   ??? Supervision of high risk pregnancy in third trimester 08/30/2016   ??? Substance abuse affecting pregnancy, antepartum 02/02/2017     Past Medical History:   Diagnosis Date   ??? Asthma    ??? Chlamydia    ??? Depression    ??? Hepatitis C    ??? Substance abuse (CMS-HCC)    ??? Urinary tract infection    ??? Varicella    Immune to HAV and HBV    Allergies   Allergen Reactions   ??? Latex Other (See Comments)   ??? Latex, Natural Rubber Other (See Comments)     Urinary tract infection       Current Outpatient Medications   Medication Sig Dispense Refill   ??? buprenorphine-naloxone (SUBOXONE) 4-1 mg Film sublingual film Place 1 Film (4 mg total) under the tongue Take as directed. 3.5 films daily: 1/2 qam, 1 qpm, and 2 qhs 98 Film 0   ??? buPROPion (WELLBUTRIN XL) 150 MG 24 hr tablet Take 1 tablet (150 mg total) by mouth daily. 30 tablet 0   ??? lamoTRIgine (LAMICTAL) 100 MG tablet Take 1 tablet (100 mg total) by mouth daily. 30 tablet 0   ??? QUEtiapine (SEROQUEL) 25 MG tablet Take 1 tablet (25 mg total) by mouth nightly as needed (Sleep and/or anxiety). One half to one tablet qhs  prn 30 tablet 0   ??? venlafaxine (EFFEXOR-XR) 75 MG 24 hr capsule Take 2 capsules (150 mg total) by mouth daily. 60 capsule 0   ??? cetirizine (ZYRTEC) 10 MG tablet Take 1 tablet (10 mg total) by mouth daily. (Patient not taking: Reported on 08/10/2018) 30 tablet 11   ??? multivitamin, prenatal, folic acid-iron, 27-1 mg Tab Take 1 tablet by mouth daily. (Patient not taking: Reported on 08/10/2018) 60 tablet 0     No current facility-administered medications for this visit.        Social history: Patient lives at [facility] until February 2020.  She has a 73 year old child who is not living with her.  She has a 8-month-old who was tested negative for hepatitis C.    Family history: Father had hepatitis C and cirrhosis.    BP 110/54 (BP Site: L Arm, BP Position: Sitting, BP Cuff Size: Large)  - Pulse 62  - Temp 36.8 ??C (98.2 ??F) (Oral)  - Resp 20  - Ht 164.5 cm (5' 4.76)  - Wt 85.9 kg (189 lb 5 oz)  - SpO2 95%  - BMI 31.73 kg/m??     Pleasant individual in NAD, overweight, multiple tattoos    HEENT: Sclera are anicteric, no temporal muscle loss, oropharynx is negative  NECK: No thyromegaly or lymphadenopathy, No carotid bruits  Chest: Clear to auscultation and percussion  Heart: S1, S2, RR, No murmurs  Abdomen: Soft, non-tender, non-distended, no hepatosplenomegaly, no masses appreciated, no ascites  Skin: No spider angiomata, No rashes  Extremities: Without pedal edema, no palmar erythema  Neuro: Grossly intact, No focal deficits        Impression:  1.  Chronic hepatitis C genotype 3.  The patient was treated in the past with sofosbuvir and daclatasvir but apparently did not complete a full course of therapy although she was negative for HCV RNA at 8 weeks into treatment.  We do not have any documentation of a potential sustained virological response.  Thus now that she is positive again for HCVRNA could be that she had a relapse versus reinfection.  We will plan to treat as a prior therapeutic failure and the patient will receive Vosevi to optimize her chances of sustained virologic response.    2.  We will check vaccination status for hepatitis A and hepatitis B and vaccinate accordingly at the time of her next visit    3.  Patient is overweight and has risk factors for [place].  We discussed the importance of trying to lose approximately 20 pounds to obtain liver health.    4.  I congratulated the patient on remaining abstinent from alcohol and drugs and she appears to be committed to remaining abstinent.    The patient will return for follow-up in 3 months or sooner to see Owens Shark, DNP once hepatitis C medications have been started.    Alba Destine, M.D.  Professor of Medicine  Director, [facility]  [place] [facility] [place] [facility] [place]    [phone]

## 2018-08-10 NOTE — Unmapped (Signed)
Chronic hepatitis C. You will be a good candidate for treatment with medications that have a high rate of cure and a low risk of side effects. Labs today, Fibroscan to assess the amount of liver scarring will be done to day. You will meet our pharmacist Erskine Squibb to discuss starting therapy. Try to lose weight in order to maintain liver health and decrease risk of fatty liver disease. Congratulations on remaining abstinent from alcohol. Return to office in 3 months or sooner once hepatitis C medications have been started to see Owens Shark, DNP.

## 2018-08-10 NOTE — Unmapped (Signed)
Counseling for HCV treatment     B18.2 Hep C: yes    K74.60 Cirrhosis: no,   Child Pugh Score if applicable and for Medicaid pts: n/a  Z94.4 Liver Transplant: no    Genotype? 3 on 06/17/18  HCV RNA: 36,185 IU/ml on 06/17/18   Fibrosis score:  F0 on Fibroscan on 02/12/14  HIV Co-infection? no  Signs of liver decompensation? no  Previous treatment? Yes, Sofosbuvir/daclatasvir x at least 8 weeks (03/11/15 - 05/06/15 per progress note from [facility]), pt reports she did not finish treatment.    Planned regimen: Vosevi (sofosbuvir/velpatasvir/voxilaprevir 400/100/100mg ) x 12 weeks  Urgency: Routine Request  Prescribing Provider/NPI: Dr. Gavin Potters / [id number]  Please see Media tab under Chart Review on date 08/10/18 for patient signature/waiver forms.  Insurance:  Medicaid    Madison Fletcher is a 32 y.o. Caucasian female presents to clinic alone and is interested in starting treatment with Vosevi. We discussed the prior authorization (PA) process of obtaining the medication through insurance and that this may take some time.  Lives at [facility] until 3pm. Call after 3pm. [facility]# for back up [phone]    Current medications:  Current Outpatient Medications   Medication Sig Dispense Refill   ??? buprenorphine-naloxone (SUBOXONE) 4-1 mg Film sublingual film Place 1 Film (4 mg total) under the tongue Take as directed. 3.5 films daily: 1/2 qam, 1 qpm, and 2 qhs 98 Film 0   ??? buPROPion (WELLBUTRIN XL) 150 MG 24 hr tablet Take 1 tablet (150 mg total) by mouth daily. 30 tablet 0   ??? lamoTRIgine (LAMICTAL) 100 MG tablet Take 1 tablet (100 mg total) by mouth daily. 30 tablet 0   ??? QUEtiapine (SEROQUEL) 25 MG tablet Take 1 tablet (25 mg total) by mouth nightly as needed (Sleep and/or anxiety). One half to one tablet qhs prn 30 tablet 0   ??? venlafaxine (EFFEXOR-XR) 75 MG 24 hr capsule Take 2 capsules (150 mg total) by mouth daily. 60 capsule 0     Following topics were discussed during counseling:    1. Indications for medication, dosage and administration.     A. Vosevi 400/100/100mg  1 tablet to take daily with food.    2. Common side effects of medications and management strategies. (fatigue, headache, diarrhea, nausea)     A. Pregnancy precaution: currently not on any birth control. She is in discussion with [facility] for best option for her. Has 2 children and does not want any more. Also had bad reaction to previous birth controls. Reports she's not allowed to have sexual encounters at [facility]. Ordered urine pregnancy test in clinic.    3. Importance of adherence to regimen, follow-up clinic visits and lab monitoring.     A.Asked patient to call Kissue Mims at [phone] before starting treatment to establish start date and to schedule TW#4 appointment.    4. Drug-drug interaction.    A. Current medications have been reviewed and assessed for possible interaction.  We discussed the mechanism of drug-drug interaction with acid lowering agents.  Advised to check with MD or pharmacist before taking any OTC/herbal medications, with emphasis regarding indigestion/heartburn medications.  Denies use of herbal medication such as milk thistle or St. John's wart.  Allergies have been verified. Denies alcohol.   5. Importance of informing pharmacy and clinic of updated contact information.  Discussed the process of obtaining medication through specialty pharmacy and when approved medication will be delivered to patient's home.  6. Fatty Liver disease - reviewed healthy diet and increasing physical activities to help with weight loss.    7. Beneficiary Readiness Evaluation form signed during clinic.  Pt will need TW#4 HCV RNA submitted to Medicaid to get full treatment duration (Continuation PA) approved. Stressed importance of TW#4 visit to patient.    Patient verbalized understanding. Provided contact information for any questions/concerns.       Park Breed, Pharm D., BCPS, CGP, CPP  [facility]  [address] [place], [place] [address]  [phone]    This portion of the visit was 30 minutes in duration and greater than 50% was spend in direct counseling and coordination of care regarding hepatitis C medication management.

## 2018-08-11 LAB — HEPATITIS A IGG: Hepatitis A virus Ab.IgG:PrThr:Pt:Ser:Ord:: REACTIVE — AB

## 2018-08-11 LAB — HEPATITIS B SURFACE ANTIGEN: Hepatitis B virus surface Ag:PrThr:Pt:Ser:Ord:: NONREACTIVE

## 2018-08-11 LAB — HEPATITIS B CORE TOTAL ANTIBODY: Hepatitis B virus core Ab:PrThr:Pt:Ser/Plas:Ord:IA: NONREACTIVE

## 2018-08-11 LAB — HEPATITIS B SURFACE ANTIBODY: Hepatitis B virus surface Ab:PrThr:Pt:Ser:Ord:: REACTIVE — AB

## 2018-08-16 MED ORDER — SOFOSBUVIR 400 MG-VELPATASVIR 100 MG-VOXILAPREVIR 100 MG TABLET
ORAL_TABLET | Freq: Every day | ORAL | 2 refills | 0.00000 days | Status: CP
Start: 2018-08-16 — End: ?
  Filled 2018-08-22: qty 28, 28d supply, fill #0

## 2018-08-16 NOTE — Unmapped (Signed)
Per test claim for VOSEVI TABLET at the [facility], patient needs Medication Assistance Program for Prior Authorization.

## 2018-08-17 NOTE — Unmapped (Signed)
Initial Counseling for HCV treatment     Planned regimen: Vosevi (sofosbuvir/velpatasvir/voxilaprevir 400/100/100mg ) x 12 weeks  PA approved til 10/15/18. Pt will require TW#4 HCV RNA for continuation PA approval.  Planned start date: 08/23/18    Pharmacy: [facility] [phone] option #4    Past Medical History:   Diagnosis Date   ??? Asthma     as child   ??? Bipolar disorder (CMS-HCC)    ??? Chlamydia    ??? Depression    ??? Hepatitis C    ??? 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 PTSD (post-traumatic stress disorder)    ??? PTSD (post-traumatic stress disorder)    ??? Substance abuse (CMS-HCC)     hx of IV drug use   ??? Urinary tract infection    ??? Varicella      Current Outpatient Medications   Medication Sig Dispense Refill   ??? buprenorphine-naloxone (SUBOXONE) 4-1 mg Film sublingual film Place 1 Film (4 mg total) under the tongue Take as directed. 3.5 films daily: 1/2 qam, 1 qpm, and 2 qhs 98 Film 0   ??? buPROPion (WELLBUTRIN XL) 150 MG 24 hr tablet Take 1 tablet (150 mg total) by mouth daily. 30 tablet 0   ??? cetirizine (ZYRTEC) 10 MG tablet Take 1 tablet (10 mg total) by mouth daily. (Patient not taking: Reported on 08/10/2018) 30 tablet 11   ??? lamoTRIgine (LAMICTAL) 100 MG tablet Take 1 tablet (100 mg total) by mouth daily. 30 tablet 0   ??? multivitamin, prenatal, folic acid-iron, 27-1 mg Tab Take 1 tablet by mouth daily. (Patient not taking: Reported on 08/10/2018) 60 tablet 0   ??? QUEtiapine (SEROQUEL) 25 MG tablet Take 1 tablet (25 mg total) by mouth nightly as needed (Sleep and/or anxiety). One half to one tablet qhs prn 30 tablet 0   ??? venlafaxine (EFFEXOR-XR) 75 MG 24 hr capsule Take 2 capsules (150 mg total) by mouth daily. 60 capsule 0     Patient is ready to start Vosevi.    Following topics were discussed during counseling:   Patient Counseling    Counseled the patient on the following:  doses and administration discussed, safe handling, storage, and disposal discussed, possible adverse effects and management discussed, possible drug and prescription drug interactions discussed, possible drug and OTC drug and food interactions discussed, lab monitoring and follow-up discussed, cost of medications and cost implications discussed, adherence and missed doses discussed, pharmacy contact information discussed        1. Indications for medication, dosage and administration.     A. Vosevi 400/100/100mg  1 tablet to take daily with food. Patient plans to take in the evenings.     2. Common side effects of medications and management strategies. (fatigue, headache, diarrhea, nausea)     A. Pregnancy precaution: currently not on any birth control.  Has 2 children and does not want any more. Also had bad reaction to previous birth controls. Reports she's not allowed to have sexual encounters at [facility]. Ordered urine pregnancy test in clinic on 08/10/18 and was negative. She will be meeting with her OB[facility]GYN [facility] at [facility] for best option for her. Requested call back on what birth control option she has decided after her meeting with OB/GYN RN.     3. Importance of adherence to regimen, follow-up clinic visits and lab monitoring.   Medication Adherence    Demonstrates understanding of importance of adherence:  no  Informant:  patient  Patient is at risk for  Non-Adherence:  No  Reasons for non-adherence:  no problems identified          Support network for adherence:  healthcare provider      Confirmed plan for next specialty medication refill:  delivery by pharmacy       A. Asked patient to call Carren Rang at [phone] to establish start date for treatment and to schedule appointment 4 weeks before starting treatment.      4. Drug-drug interaction.  Drug Interactions    Drug interactions evaluated:  yes  Clinically relevant drug interactions identified:  yes   Interactions list:  Omeprazole 20mg  - may decrease efficacy of Vosevi   Drug management plan:   We discussed the mechanism of drug-drug interaction with acid lowering agents such as omeprazole may decrease the absorption of Vosevi.  Instructed that if pt must take omeprazole, pt may take max dose of 20mg  at the same time as Vosevi.             A. Current medications have been reviewed and assessed for possible interaction.  Currently not taking any antacid/heartburn medications but recently purchased omeprazole 20mg  for heartburns. We discussed the mechanism of drug-drug interaction with acid lowering agents.  Advised to check with MD or pharmacist before taking any OTC/herbal medications, with emphasis regarding indigestion/heartburn medications.  Denies use of herbal medication such as milk thistle or St. John's wart.  Allergies have been verified. Denies alcohol.     5. Importance of informing pharmacy and clinic of updated contact information.  Stressed importance of being able to reach over the phone to set up refills. Advised patient to call pharmacy when down to about 7 day supply left to ensure there's no interruption in therapy.  Refill Coordination    Has the Patients' Contact Information Changed:  No  Is the Shipping Address Different:  No       Shipping Information    Delivery Scheduled:  Yes       Patient verbalized understanding. Provided contact information for any questions/concerns.       Park Breed, Pharm D., BCPS, CGP, CPP  [facility]  [address]  [place], [place] [address]  [phone]    August 18, 2018 3:37 PM

## 2018-08-17 NOTE — Unmapped (Signed)
[facility] Specialty Medication Referral: PA APPROVED    Medication (Brand/Generic): VOSEVI    Initial Test Claim completed with resulted information below:  No PA required  Patient ABLE to fill at [facility]  Insurance Company:  [place] MEDICAID  Anticipated Copay: $3 MAX  Is anticipated copay with a copay card or grant? NO    Does patient's insurance plan only allow a 15 day supply for the first 6 fills in the [facility]? NO  If yes, inform patient they can request to dis-enroll from the [facility] by calling the patient help desk at N/A.      As Co-pay is under $25 defined limit, per policy there will be no further investigation of need for financial assistance at this time unless patient requests. This referral has been communicated to the provider and handed off to the [facility] team for further processing and filling of prescribed medication.   ______________________________________________________________________  Please utilize this referral for viewing purposes as it will serve as the central location for all relevant documentation and updates.

## 2018-08-22 MED ORDER — BUPRENORPHINE 8 MG-NALOXONE 2 MG SUBLINGUAL FILM
ORAL_FILM | SUBLINGUAL | 0 refills | 0 days | Status: CP
Start: 2018-08-22 — End: 2018-09-19

## 2018-08-22 MED FILL — VOSEVI 400 MG-100 MG-100 MG TABLET: 28 days supply | Qty: 28 | Fill #0 | Status: AC
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
## 2018-08-26 ENCOUNTER — Ambulatory Visit
Admit: 2018-08-26 | Discharge: 2018-08-27 | Payer: MEDICAID | Attending: Student in an Organized Health Care Education/Training Program | Primary: Student in an Organized Health Care Education/Training Program

## 2018-08-26 DIAGNOSIS — Z720 Tobacco use: Secondary | ICD-10-CM

## 2018-08-26 DIAGNOSIS — N898 Other specified noninflammatory disorders of vagina: Principal | ICD-10-CM

## 2018-08-26 DIAGNOSIS — Z7251 High risk heterosexual behavior: Secondary | ICD-10-CM

## 2018-08-26 DIAGNOSIS — Z309 Encounter for contraceptive management, unspecified: Secondary | ICD-10-CM

## 2018-08-26 LAB — URINALYSIS
BILIRUBIN UA: NEGATIVE
BLOOD UA: NEGATIVE
GLUCOSE UA: NEGATIVE
KETONES UA: NEGATIVE
NITRITE UA: NEGATIVE
PH UA: 5 (ref 5.0–9.0)
PROTEIN UA: NEGATIVE
RBC UA: 2 /HPF (ref <=4)
SPECIFIC GRAVITY UA: 1.025 (ref 1.003–1.030)
SQUAMOUS EPITHELIAL: 3 /HPF (ref 0–5)
UROBILINOGEN UA: 0.2
WBC UA: <1 /HPF (ref 0–5)

## 2018-08-26 LAB — BACTERIA

## 2018-08-26 MED ORDER — LEVONORGESTREL-ETHINYL ESTRADIOL 0.1 MG-20 MCG TABLET
ORAL_TABLET | Freq: Every day | ORAL | 2 refills | 0.00000 days | Status: CP
Start: 2018-08-26 — End: 2019-04-05

## 2018-08-26 NOTE — Unmapped (Addendum)
[facility]  MED-PEDS ADULT SICK VISIT    Assessment & Plan:   Madison Fletcher is a 32 y.o. female who presents for vaginal irritation.   Madison Fletcher was seen today for possible yeast infection, bpa review: and fyi:.    Diagnoses and all orders for this visit:    Vaginal irritation: Reports one week of itchiness, white discharge, urinary urgency, and dysuria. Has history of PID though no CMT or adnexal tenderness on exam today. Differential includes STI (last active one month ago with partner who has multiple unknown partners), UTI, and candidal vulvovaginitis. Will assess for all three.  -     Urinalysis  -     Wet Prep, Genital  -     HIV Antigen/Antibody Combo  -     RPR  -     Chlamydia/Gonorrhoeae NAA    Contraceptive management: Patient needs to be on birth control while on HCV treatment. Assessed risk of starting OCP with her current medications and active smoking and feel comfortable proceeding with a short course of combine OCPs until patient establishes with gynecology for possible initiation of LARC.  -     Obstetrics / Gynecology referral  -     POCT Pregnancy Urine  -     levonorgestrel-ethinyl estradiol (AVIANE,ALESSE,LESSINA) 0.1-20 mg-mcg per tablet; Take 1 tablet by mouth daily.      Orders this visit:  Orders Placed This Encounter   Procedures   ??? Chlamydia/Gonorrhoeae NAA     Standing Status:   Future     Number of Occurrences:   1     Standing Expiration Date:   08/27/2019   ??? Wet Prep, Genital   ??? Urinalysis     Standing Status:   Future     Standing Expiration Date:   08/27/2019   ??? HIV Antigen/Antibody Combo     Standing Status:   Future     Standing Expiration Date:   08/27/2019   ??? RPR     Standing Status:   Future     Standing Expiration Date:   08/27/2019   ??? Obstetrics / Gynecology     Standing Status:   Future     Standing Expiration Date:   08/27/2019     Referral Priority:   Routine     Referral Type:   Generic Referral     Referral Reason: Specialty Services Required     Number of Visits Requested:   1   ??? POCT Pregnancy Urine, Qualitative     Standing Status:   Future     Standing Expiration Date:   08/27/2019   ??? POCT urinalysis dipstick     Standing Status:   Future     Standing Expiration Date:   08/27/2019     Future Appointments   Date Time Provider Department Center   09/29/2018 10:30 AM Dawn Renato Gails, FNP [facility]   10/05/2018  9:00 AM Runell Gess, PhD [facility]   11/09/2018  9:00 AM [facility] [place] [facility] [place] [facility]   11/09/2018 10:00 AM Dawn Renato Gails, FNP [facility]       This patient was discussed with attending physician, Dr. Murlean Iba, who agrees with the assessment and plan above.    Subjective:   Madison Fletcher is a 32 y.o. female here for   Chief Complaint   Patient presents with   ??? Possible Yeast Infection  Yeast vs BV - Itchyness, Discharge, x 1 week - Pt states curently in a program - Rehab and is NOT sexually active. Hx of PID - pain in the lower LEFT side of abdominal area - pt did NOT complete previous antibiotic.    ??? BPA Review:     Due: PHQ-9 - Delayed    ??? FYI:     **Currently taking Hep C medication - getting extreme heartburn - was advised by Liver doctor she could NOT take medication for heartburn - she would like to discuss what she can do for the heartburn.  Also Liver provider advised pt she must get on BC pills.      Patient states she gets yeast infections and UTIs all the time. She feels like she has all of these. She was also told she had PID. She states she never finished her antibiotic course. For a week, she's had itchiness, discharge, some odor (sweet). Also having dysuria and urinary urgency. Has intermittent pelvic pain and this has been going on for months. Not currently sexually active. Last active a month ago. Hasn't had a change in partner in 4 years. However partner has had sexual relations with other men. Did not used condom, had him withdraw.    Patient states her GI specialists would like her on birth control. Patient is in interested in IUD but willing to start with OCPs. Smokes 1 pack/ 3 days. No history of migraines with aura. No history of VTE.    Comprehensive Medical and Social History  Patient Active Problem List   Diagnosis   ??? Chronic hepatitis C   ??? PTSD (post-traumatic stress disorder)   ??? Urticaria   ??? Red blood cell antibody positive   ??? HSV-2 seropositive   ??? History of alcohol abuse   ??? Asthma affecting pregnancy, antepartum   ??? Pyelectasis of fetus on prenatal ultrasound   ??? Alcohol use disorder, moderate, in early remission (CMS-HCC)   ??? Use of nonprescription opiate drugs (CMS-HCC)     Past Medical History:   Diagnosis Date   ??? Asthma     as child   ??? Bipolar disorder (CMS-HCC)    ??? Chlamydia    ??? Depression    ??? Hepatitis C    ??? 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 PTSD (post-traumatic stress disorder)    ??? PTSD (post-traumatic stress disorder)    ??? Substance abuse (CMS-HCC)     hx of IV drug use   ??? Urinary tract infection    ??? Varicella      Past Surgical History:   Procedure Laterality Date   ??? PR UPPER GI ENDOSCOPY,BIOPSY N/A 03/01/2015    Procedure: UGI ENDOSCOPY; WITH BIOPSY, SINGLE OR MULTIPLE;  Surgeon: Janyth Pupa, MD;  [facility];  Service: Gastroenterology     Allergies   Allergen Reactions   ??? Latex Other (See Comments)   ??? Latex, Natural Rubber Other (See Comments)     Urinary tract infection     Current Outpatient Medications   Medication Sig Dispense Refill   ??? buprenorphine-naloxone (SUBOXONE) 8-2 mg sublingual film Place 1 Film (8 mg total) under the tongue Take as directed. for 28 days 1.5 film sL daily 42 Film 0   ??? [START ON 09/02/2018] buPROPion (WELLBUTRIN XL) 150 MG 24 hr tablet Take 1 tablet (150 mg total) by mouth daily. 30 tablet 0   ??? cetirizine (ZYRTEC) 10 MG tablet Take 1 tablet (10 mg total)  by mouth daily. 30 tablet 11   ??? [START ON 09/02/2018] lamoTRIgine (LAMICTAL) 100 MG tablet Take 1 tablet (100 mg total) by mouth daily. 30 tablet 0   ??? multivitamin, prenatal, folic acid-iron, 27-1 mg Tab Take 1 tablet by mouth daily. 60 tablet 0   ??? [START ON 09/02/2018] QUEtiapine (SEROQUEL) 25 MG tablet Take 1 tablet (25 mg total) by mouth nightly as needed (Sleep and/or anxiety). One half to one tablet qhs prn 30 tablet 0   ??? sofosbuvir-velpatas-voxilaprev (VOSEVI) tablet Take 1 tablet by mouth daily. Take with food. 28 tablet 2   ??? [START ON 09/02/2018] venlafaxine (EFFEXOR-XR) 75 MG 24 hr capsule Take 2 capsules (150 mg total) by mouth daily. 60 capsule 0   ??? levonorgestrel-ethinyl estradiol (AVIANE,ALESSE,LESSINA) 0.1-20 mg-mcg per tablet Take 1 tablet by mouth daily. 30 tablet 2     No current facility-administered medications for this visit.        Review of Systems  A 12 point review of systems was negative except for pertinent items noted in the HPI.     Objective:     Vitals:    08/26/18 1347   BP: 124/82   BP Site: R Arm   BP Position: Sitting   BP Cuff Size: Large   Pulse: 74   Resp: 18   SpO2: 98%   Weight: 88 kg (193 lb 14.4 oz)   Height: 164.5 cm (5' 4.76)     Wt Readings from Last 3 Encounters:   08/26/18 88 kg (193 lb 14.4 oz)   08/10/18 85.9 kg (189 lb 5 oz)   06/09/18 83.4 kg (183 lb 12.8 oz)     Body mass index is 32.5 kg/m??.  Gen: well-appearing and pleasant, sitting in chair in NAD.  HEENT: PERRL, EOMI, No Icterus  CVS: S1 S2, No MRG.  PULM: CTAB, no increased WOB. No wheezing or crackles appreciated  ABD: +BS, Soft, NT, ND  GU: Thin white discharge along vaginal wall, no CMT or adnexal tenderness  EXTR: No LE Edema. Pulses 2+ in distal extremities bilaterally  PSYCH: No agitation or depression or anxiety.

## 2018-08-26 NOTE — Unmapped (Signed)
Madison Fletcher, thank you for coming in today! We've sent off a number of tests - I'll be in touch if any of them return positive and you need treatment.     I've also prescribed a birth control pill that I'd like you to start using until you see the gynecologist. The gynecology office should call you soon to make an appointment and further discuss an IUD.

## 2018-08-27 LAB — HIV ANTIGEN/ANTIBODY COMBO: HIV 1+2 Ab+HIV1 p24 Ag:PrThr:Pt:Ser/Plas:Ord:IA: NONREACTIVE

## 2018-08-29 LAB — SYPHILIS RPR SCREEN: Reagin Ab:PrThr:Pt:Ser:Ord:RPR: NONREACTIVE

## 2018-08-29 LAB — RPR: SYPHILIS RPR SCREEN: NONREACTIVE

## 2018-09-02 MED ORDER — BUPROPION HCL XL 150 MG 24 HR TABLET, EXTENDED RELEASE
ORAL_TABLET | Freq: Every day | ORAL | 0 refills | 0.00000 days | Status: CP
Start: 2018-09-02 — End: 2018-09-29

## 2018-09-02 MED ORDER — QUETIAPINE 25 MG TABLET
ORAL_TABLET | Freq: Every evening | ORAL | 0 refills | 0 days | Status: CP | PRN
Start: 2018-09-02 — End: 2018-09-29

## 2018-09-02 MED ORDER — LAMOTRIGINE 100 MG TABLET
ORAL_TABLET | Freq: Every day | ORAL | 0 refills | 0 days | Status: CP
Start: 2018-09-02 — End: 2018-09-29

## 2018-09-02 MED ORDER — VENLAFAXINE ER 75 MG CAPSULE,EXTENDED RELEASE 24 HR
ORAL_CAPSULE | Freq: Every day | ORAL | 0 refills | 0.00000 days | Status: CP
Start: 2018-09-02 — End: 2018-09-29

## 2018-09-14 MED FILL — VOSEVI 400 MG-100 MG-100 MG TABLET: 28 days supply | Qty: 28 | Fill #1 | Status: AC

## 2018-09-14 MED FILL — VOSEVI 400 MG-100 MG-100 MG TABLET: ORAL | 28 days supply | Qty: 28 | Fill #1

## 2018-09-14 NOTE — Unmapped (Signed)
[facility] Refill Coordination Note  Medication: vosevi    Unable to reach patient to schedule shipment for medication being filled at [facility]. one number allowed me to leave a message- the other was a dial/busy tone each time i tried.  As this is the 3rd unsuccessful attempt to reach the patient, no additional phone call attempts will be made at this time.      Phone numbers attempted: [phone] and [phone]  Last scheduled delivery: 11/4 (28 day supply)    Please call the [facility] at [phone] (option 4) should you have any further questions.      Thanks,  [facility] Specialty Team

## 2018-09-14 NOTE — Unmapped (Signed)
Follow-Up Counseling for HCV Treatment      Regimen: Vosevi (sofosbuvir/velpatasvir/voxilaprevir 400/100/100mg ) x 12 weeks  Start Date: 08/25/18  Treatment Week #3    Pharmacy: [facility] [phone]     Following topics were reviewed during the phone call:  Patient Counseling    Counseled the patient on the following:  doses and administration discussed, possible adverse effects and management discussed, possible drug and prescription drug interactions discussed, possible drug and OTC drug and food interactions discussed, lab monitoring and follow-up discussed, therapeutic rationale discussed, adherence and missed doses discussed, pharmacy contact information discussed         1. Medication administration - Takes Vosevi 1 tablet daily with food at 6:00 PM with dinner.     2. Importance of adherence -   Medication Adherence    Patient Reported X Missed Doses in the Last Month:  0  Specialty Medication:  Vosevi  Patient is on additional specialty medications:  No  Patient is on more than two specialty medications:  No  Gaps in Refill History Greater than 2 Weeks in the Last 3 Months:  No  Demonstrates Understanding of Importance of Adherence:  Yes  Informant:  patient  Reliability of Informant:  reliable  Provider-Estimated Medication Adherence Level:  90-100%  Patient is at risk for Non-Adherence:  No   Reasons for Non-Adherence:  no problems identified  Other Adherence Tool:  Medication is handed out at [employer] at 6:00 PM  Support Network for Adherence:  healthcare provider  Confirmed Plan for Next Specialty Medication Refill:  delivery by pharmacy  Refills Needed for Supportive Medications:  yes, ordered or provider notified  Medication Assistance Program  Has Prior Authorization Been Obtained:  Yes  Was Copay Assistance Provided:  No  Has the Patient Been informed of Copay Responsibility:  Yes  Was Kennedy Bucker Assistance Provided:  No  Refill Coordination  Has the Patients' Contact Information Changed:  No    Is the Shipping Address Different:  No    Shipping Information  Delivery Scheduled:  Yes  Delivery Date:  09/14/18  Medications to be Shipped:  Vosevi       Pt stated [facility] handles her medications but confirmed she has not missed any doses.     3. Side effects - Pt reports having frequent headaches and some nausea when she does not eat a big enough meal. Advised pt she is able to take up to 2,000 mg of Tylenol daily as needed. Pt encouraged to increase her water intake as she reports drinking very little water.   Adverse Effects        Nausea:  Pos  Headaches:  Pos         4. Drug-drug interaction  Pt denies starting any new medications. Denies any OTC use. Pt reports being sober for 6 months now and no alcohol intake. Pt reports discontinuing her omeprazole during tx as she does not want to jeopardize treatment. Reminded pt if she does need it to remember to take no more than 20 mg at the same time as Vosevi.   Drug Interactions    Drug interactions evaluated:  yes  Clinically relevant drug interactions identified:  yes   Interactions list:  Omeprazole 20mg  - may decrease efficacy of Vosevi   Drug management plan:   We discussed the mechanism of drug-drug interaction with acid lowering agents such as omeprazole may decrease the absorption of Vosevi.  Instructed that if pt must take omeprazole, pt  may take max dose of 20mg  at the same time as Vosevi.               5. Follow up - Has follow up appointment scheduled in HCV treatment clinic on 09/29/18 @ 10:30 am with Owens Shark, DNP.Marland Kitchen  Advised patient to call pharmacy when down to about 7 day supply left to ensure there's no interruption in therapy. Confirmed pt's address on Epic:[address] [place] [address]. Requested same day courier delivery for 09/14/18 via scheduling pool. Pt stated she will be home today at 1:30 PM.   Refill Coordination    Has the Patients' Contact Information Changed:  No  Is the Shipping Address Different:  No       Shipping Information    Delivery Scheduled:  Yes  Delivery Date:  09/14/18  Medications to be Shipped:  Vosevi         All questions were answered.    Vertell Limber RN, [facility]   Pharmacy Adult GI Medicine  [facility]  [address]   [place], [place] [address]  [phone]    September 14, 2018 9:08 AM

## 2018-09-19 MED ORDER — BUPRENORPHINE 8 MG-NALOXONE 2 MG SUBLINGUAL FILM
ORAL_FILM | SUBLINGUAL | 0 refills | 0 days | Status: CP
Start: 2018-09-19 — End: 2018-10-06

## 2018-09-20 NOTE — Unmapped (Signed)
Orders Only:  09/19/18   Madison Fletcher  DOB:January 28, 1986     Pt is participating in [facility] Residential treatment program.  UDS monitored by [facility]' staff.        --Berneda Rose Suboxone (buprenorphine-naloxone) 8-2 mg sublingual films: as directed 1.5 films daily  -- today prescription for 28 days supply (#42 films) with no refills has been called in to [facility].     Pt missed her appointment today and this will need to be rescheduled.       Zelphia Cairo, MD            Zelphia Cairo, MD

## 2018-09-24 ENCOUNTER — Ambulatory Visit
Admit: 2018-09-24 | Discharge: 2018-09-24 | Disposition: A | Discharge status: Home / Self Care | Payer: MEDICAID | Attending: Emergency Medicine

## 2018-09-24 LAB — COMPREHENSIVE METABOLIC PANEL
ALBUMIN: 4.1 g/dL (ref 3.5–5.0)
ALKALINE PHOSPHATASE: 87 U/L (ref 38–126)
ANION GAP: 7 mmol/L (ref 7–15)
AST (SGOT): 26 U/L (ref 14–38)
BLOOD UREA NITROGEN: 16 mg/dL (ref 7–21)
BUN / CREAT RATIO: 25
CALCIUM: 9.4 mg/dL (ref 8.5–10.2)
CHLORIDE: 105 mmol/L (ref 98–107)
CO2: 27 mmol/L (ref 22.0–30.0)
CREATININE: 0.64 mg/dL (ref 0.60–1.00)
EGFR CKD-EPI AA FEMALE: >90 mL/min/{1.73_m2} (ref >=60)
EGFR CKD-EPI NON-AA FEMALE: >90 mL/min/{1.73_m2} (ref >=60)
GLUCOSE RANDOM: 114 mg/dL (ref 65–179)
POTASSIUM: 5 mmol/L (ref 3.5–5.0)
PROTEIN TOTAL: 7.8 g/dL (ref 6.5–8.3)
SODIUM: 139 mmol/L (ref 135–145)

## 2018-09-24 LAB — CBC W/ AUTO DIFF
BASOPHILS ABSOLUTE COUNT: 0 10*9/L (ref 0.0–0.1)
EOSINOPHILS ABSOLUTE COUNT: 0.2 10*9/L (ref 0.0–0.4)
EOSINOPHILS RELATIVE PERCENT: 1.4 %
HEMATOCRIT: 47.5 % — ABNORMAL HIGH (ref 36.0–46.0)
HEMOGLOBIN: 15.2 g/dL (ref 12.0–16.0)
LARGE UNSTAINED CELLS: 1 % (ref 0–4)
LYMPHOCYTES ABSOLUTE COUNT: 0.6 10*9/L — ABNORMAL LOW (ref 1.5–5.0)
MEAN CORPUSCULAR HEMOGLOBIN CONC: 32 g/dL (ref 31.0–37.0)
MEAN CORPUSCULAR HEMOGLOBIN: 30.6 pg (ref 26.0–34.0)
MEAN CORPUSCULAR VOLUME: 95.5 fL (ref 80.0–100.0)
MEAN PLATELET VOLUME: 8.2 fL (ref 7.0–10.0)
MONOCYTES ABSOLUTE COUNT: 0.4 10*9/L (ref 0.2–0.8)
MONOCYTES RELATIVE PERCENT: 2.9 %
NEUTROPHILS ABSOLUTE COUNT: 13.4 10*9/L — ABNORMAL HIGH (ref 2.0–7.5)
PLATELET COUNT: 258 10*9/L (ref 150–440)
RED BLOOD CELL COUNT: 4.97 10*12/L (ref 4.00–5.20)
RED CELL DISTRIBUTION WIDTH: 12.9 % (ref 12.0–15.0)
WBC ADJUSTED: 14.7 10*9/L — ABNORMAL HIGH (ref 4.5–11.0)

## 2018-09-24 LAB — URINALYSIS
BACTERIA: NONE SEEN /HPF
BILIRUBIN UA: NEGATIVE
BLOOD UA: NEGATIVE
GLUCOSE UA: NEGATIVE
KETONES UA: NEGATIVE
LEUKOCYTE ESTERASE UA: NEGATIVE
NITRITE UA: NEGATIVE
PH UA: 6 (ref 5.0–9.0)
PROTEIN UA: 30 — AB
RBC UA: <1 /HPF (ref <=4)
SPECIFIC GRAVITY UA: 1.037 — ABNORMAL HIGH (ref 1.003–1.030)
UROBILINOGEN UA: 0.2
WBC UA: 3 /HPF (ref 0–5)

## 2018-09-24 LAB — BUN / CREAT RATIO: Urea nitrogen/Creatinine:MRto:Pt:Ser/Plas:Qn:: 25

## 2018-09-24 LAB — BACTERIA: Lab: NONE SEEN

## 2018-09-24 LAB — LIPASE: Triacylglycerol lipase:CCnc:Pt:Ser/Plas:Qn:: 505 — ABNORMAL HIGH

## 2018-09-24 LAB — MEAN CORPUSCULAR VOLUME: Lab: 95.5

## 2018-09-24 MED ORDER — ONDANSETRON 4 MG DISINTEGRATING TABLET
ORAL_TABLET | Freq: Three times a day (TID) | ORAL | 0 refills | 0.00000 days | Status: CP | PRN
Start: 2018-09-24 — End: 2018-10-01

## 2018-09-24 NOTE — Unmapped (Signed)
Patient rounds completed. The following patient needs were addressed:  Pain, Toileting, Positioning;   SUPINE, Personal Belongings, Plan of Care, Call Bell in Reach and Bed Position Low .

## 2018-09-24 NOTE — Unmapped (Signed)
Pt put on monitor &  provided with 2 heated blankets. Pt laying on bed with eyes closed.

## 2018-09-24 NOTE — Unmapped (Signed)
MD at bedside. 

## 2018-09-24 NOTE — Unmapped (Signed)
[facility]  Emergency Department Provider Note      Initial Impression, ED Course, Assessment and Plan     Madison Fletcher is a 32 y.o. female history of substance abuse on Suboxone, chronic otitis C, bipolar, presenting with 3 hours of vomiting and abdominal pain, son with vomiting illness.    On exam she appears somewhat disheveled but nontoxic. Vital signs are reassuring.  Abdomen is soft, mild tender diffusely.    Differential includes viral syndrome/gastritis, pancreatitis. She reports she feels like she is withdrawing. Missed dose of suboxone yesterday; took her normal dose today.    4:44 PM  Reports feeling better. Wants to   Tolerated p.o.  Labs reviewed.  Her lipase is mildly elevated.  Her abdominal pain is predominantly umbilical and lower, not epigastric.  She denies any alcohol use.  I have a low suspicion for pancreatitis.  She was advised to start with a clear liquid diet and slowly advance as tolerated, return if she has worsening abdominal pain.    Urinalysis is still pending.  Patient will be signed out to oncoming provider.  After her urine results if she continues to feel well I think she can be discharged home.      Results for orders placed or performed during the hospital encounter of 09/24/18   Comprehensive metabolic panel   Result Value Ref Range    Sodium 139 135 - 145 mmol/L    Potassium 5.0 3.5 - 5.0 mmol/L    Chloride 105 98 - 107 mmol/L    CO2 27.0 22.0 - 30.0 mmol/L    BUN 16 7 - 21 mg/dL    Creatinine 1.61 0.96 - 1.00 mg/dL    BUN/Creatinine Ratio 25     EGFR CKD-EPI Non-African American, Female >90 >=60 mL/min/1.52m2    EGFR CKD-EPI African American, Female >90 >=60 mL/min/1.68m2    Glucose 114 65 - 179 mg/dL    Calcium 9.4 8.5 - 04.5 mg/dL    Albumin 4.1 3.5 - 5.0 g/dL    Total Protein 7.8 6.5 - 8.3 g/dL    Total Bilirubin 0.6 0.0 - 1.2 mg/dL    AST 26 14 - 38 U/L    ALT 17 <35 U/L    Alkaline Phosphatase 87 38 - 126 U/L    Anion Gap 7 7 - 15 mmol/L   Lipase Result Value Ref Range    Lipase 505 (H) 44 - 232 U/L   CBC w/ Differential   Result Value Ref Range    WBC 14.7 (H) 4.5 - 11.0 10*9/L    RBC 4.97 4.00 - 5.20 10*12/L    HGB 15.2 12.0 - 16.0 g/dL    HCT 40.9 (H) 81.1 - 46.0 %    MCV 95.5 80.0 - 100.0 fL    MCH 30.6 26.0 - 34.0 pg    MCHC 32.0 31.0 - 37.0 g/dL    RDW 91.4 78.2 - 95.6 %    MPV 8.2 7.0 - 10.0 fL    Platelet 258 150 - 440 10*9/L    Neutrophils % 91.1 %    Lymphocytes % 4.0 %    Monocytes % 2.9 %    Eosinophils % 1.4 %    Basophils % 0.1 %    Neutrophil Left Shift 1+ (A) Not Present    Absolute Neutrophils 13.4 (H) 2.0 - 7.5 10*9/L    Absolute Lymphocytes 0.6 (L) 1.5 - 5.0 10*9/L    Absolute Monocytes 0.4 0.2 - 0.8  10*9/L    Absolute Eosinophils 0.2 0.0 - 0.4 10*9/L    Absolute Basophils 0.0 0.0 - 0.1 10*9/L    Large Unstained Cells 1 0 - 4 %       Additional Medical Decision Making     I have reviewed the vital signs and the nursing notes. Labs and radiology results that were available during my care of the patient were independently reviewed by me and considered in my medical decision making.      I reviewed the patient's prior medical records.    History     HPI   Madison Fletcher is a 32 y.o. female with PMH of bipolar disorder, depression, chronic hepatitis C, substance abuse (on suboxone), EtOH abuse, and tobacco abuse presenting to the ED for abdominal pain. Patient reports constant, waxing and waning, diffuse abdominal pain with associated generalized weakness, nausea,  and multiple episodes of bilious vomiting that began this morning upon waking. She denies diarrhea, but states she feels the urge to go. Her last BM was 3-4 days ago, although this is typical for her due to being on suboxone. Patient notes she did not take her dose of suboxone yesterday. This morning, she took her normal sublingual dose prior to onset of vomiting. She states she feels as if she is going through withdrawals currently. Patient's son is currently sick with diarrhea. Patient also endorses dysuria and vaginal discharge that began about two weeks ago. She was evaluated by her PCP at this time with a normal UA and STD screen. Patient denies fevers, diarrhea, or any other medical concerns at this time.      Past Medical History:   Diagnosis Date   ??? Asthma     as child   ??? Bipolar disorder (CMS-HCC)    ??? Chlamydia    ??? Depression    ??? Hepatitis C    ??? 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 PTSD (post-traumatic stress disorder)    ??? PTSD (post-traumatic stress disorder)    ??? Substance abuse (CMS-HCC)     hx of IV drug use   ??? Urinary tract infection    ??? Varicella        Past Surgical History:   Procedure Laterality Date   ??? PR UPPER GI ENDOSCOPY,BIOPSY N/A 03/01/2015    Procedure: UGI ENDOSCOPY; WITH BIOPSY, SINGLE OR MULTIPLE;  Surgeon: Janyth Pupa, MD;  [facility];  Service: Gastroenterology       No current facility-administered medications for this encounter.     Current Outpatient Medications:   ???  buprenorphine-naloxone (SUBOXONE) 8-2 mg sublingual film, Place 1 Film (8 mg total) under the tongue Take as directed. for 28 days 1.5 film sL daily, Disp: 42 Film, Rfl: 0  ???  buPROPion (WELLBUTRIN XL) 150 MG 24 hr tablet, Take 1 tablet (150 mg total) by mouth daily., Disp: 30 tablet, Rfl: 0  ???  cetirizine (ZYRTEC) 10 MG tablet, Take 1 tablet (10 mg total) by mouth daily., Disp: 30 tablet, Rfl: 11  ???  lamoTRIgine (LAMICTAL) 100 MG tablet, Take 1 tablet (100 mg total) by mouth daily., Disp: 30 tablet, Rfl: 0  ???  levonorgestrel-ethinyl estradiol (AVIANE,ALESSE,LESSINA) 0.1-20 mg-mcg per tablet, Take 1 tablet by mouth daily., Disp: 30 tablet, Rfl: 2  ???  multivitamin, prenatal, folic acid-iron, 27-1 mg Tab, Take 1 tablet by mouth daily., Disp: 60 tablet, Rfl: 0  ???  ondansetron (ZOFRAN-ODT) 4 MG disintegrating tablet, Take 1 tablet (4 mg total)  by mouth every eight (8) hours as needed for nausea. for up to 7 days, Disp: 12 tablet, Rfl: 0  ???  QUEtiapine (SEROQUEL) 25 MG tablet, Take 1 tablet (25 mg total) by mouth nightly as needed (Sleep and/or anxiety). One half to one tablet qhs prn, Disp: 30 tablet, Rfl: 0  ???  sofosbuvir-velpatas-voxilaprev (VOSEVI) tablet, Take 1 tablet by mouth daily. Take with food., Disp: 28 tablet, Rfl: 2  ???  venlafaxine (EFFEXOR-XR) 75 MG 24 hr capsule, Take 2 capsules (150 mg total) by mouth daily., Disp: 60 capsule, Rfl: 0    Allergies  Latex and Latex, natural rubber    Family History   Problem Relation Age of Onset   ??? Asthma Mother    ??? Bleeding Disorder Mother    ??? COPD Mother    ??? Diabetes Mother    ??? Asthma Father    ??? Cancer Father    ??? Seizures Father    ??? Liver cancer Neg Hx        Social History  Social History     Tobacco Use   ??? Smoking status: Current Every Day Smoker     Packs/day: 0.35     Years: 20.00     Pack years: 7.00     Types: Cigarettes     Start date: 05/05/1998   ??? Smokeless tobacco: Never Used   ??? Tobacco comment: not ready states too much stress   Substance Use Topics   ??? Alcohol use: No     Comment: last drink was one year ago    ??? Drug use: Not Currently     Types: Heroin     Comment: [facility] since March 2018         Review of Systems  A 10-point review of systems was performed and is negative except as documented in HPI.      Physical Exam       BP 124/90  - Pulse 59  - Resp 18  - SpO2 96%        Constitutional: Alert and oriented. Well appearing and in no distress.  Eyes: Conjunctivae are normal. Sclera anicteric.  ENT       Head: Normocephalic and atraumatic.       Nose: No congestion.       Mouth/Throat: Mucous membranes are moist.       Neck: No stridor.  Hematological/Lymphatic/Immunilogical: No cervical lymphadenopathy.  Cardiovascular: Normal rate, regular rhythm. No murmurs/gallops, or rubs. Distal pulses are present.  Respiratory: Normal respiratory effort. Breath sounds are normal.  Gastrointestinal: Mild diffuse abdominal tenderness. Soft, nondistended.  Musculoskeletal: Normal range of motion in all extremities.       Right lower leg: No tenderness or edema.       Left lower leg: No tenderness or edema.  Neurologic: Normal speech and language. No gross focal neurologic deficits are appreciated.  Skin: Skin is warm, dry and intact. No rash noted.  Psychiatric: Mood and affect are normal. Speech and behavior are normal.        ______________________________________________________   Documentation assistance was provided by Rolan Lipa, Scribe, on September 24, 2018 at 12:58 PM for Wylene Simmer, MD.    September 24, 2018 12:46 PM. Documentation assistance provided by the scribe. I was present during the time the encounter was recorded. The information recorded by the scribe was done at my direction and has been reviewed and validated by me.          Clydie Braun  Lynita Lombard, MD  09/24/18 [phone]

## 2018-09-24 NOTE — Unmapped (Addendum)
BIB [facility] due to abd pain. VSS. Pt skipped her suboxone pill yesterday. PT took her typical 1/2 pill dose this morning. Pt is having N/V/D

## 2018-09-25 NOTE — Unmapped (Signed)
Pt stable at time of discharge. 

## 2018-09-25 NOTE — Unmapped (Signed)
Patient rounds completed. The following patient needs were addressed:  Pain, Toileting, Positioning;   SUPINE, Personal Belongings, Plan of Care, Call Bell in Reach and Bed Position Low .

## 2018-09-29 MED ORDER — LAMOTRIGINE 100 MG TABLET
ORAL_TABLET | Freq: Every day | ORAL | 0 refills | 0.00000 days | Status: CP
Start: 2018-09-29 — End: 2018-10-31

## 2018-09-29 MED ORDER — QUETIAPINE 25 MG TABLET
ORAL_TABLET | Freq: Every evening | ORAL | 0 refills | 0 days | Status: CP | PRN
Start: 2018-09-29 — End: 2018-10-31

## 2018-09-29 MED ORDER — BUPROPION HCL XL 150 MG 24 HR TABLET, EXTENDED RELEASE
ORAL_TABLET | Freq: Every day | ORAL | 0 refills | 0 days | Status: CP
Start: 2018-09-29 — End: 2018-10-31

## 2018-09-29 MED ORDER — VENLAFAXINE ER 75 MG CAPSULE,EXTENDED RELEASE 24 HR
ORAL_CAPSULE | Freq: Every day | ORAL | 0 refills | 0 days | Status: CP
Start: 2018-09-29 — End: 2018-10-31

## 2018-09-29 NOTE — Unmapped (Signed)
Orders Only:  09/29/18   Madison Fletcher  DOB:1986/01/20     Pt is participating in [facility] Residential treatment program.  UDS monitored by [employer]' staff.    I have renewed pt's Bupropion, Effexor, Lamictal and Seroquel x 30 days with no rf (sent to [facility])     Pt missed her appointment 12/2 and this will need to be rescheduled.       Zelphia Cairo, MD            Zelphia Cairo, MD

## 2018-10-04 NOTE — Unmapped (Signed)
Spoke with patient     This [facility] with a reminder about  your appointment on 10/05/18 at 9a.  .  If you cannot make your appointment, please call [phone].    Thank you

## 2018-10-04 NOTE — Unmapped (Addendum)
*  delivery delayed- pt needs continuation PA- Jane aware and [facility] are working [facility] [employer]    [facility] Refill Coordination Note    Specialty Medication(s) to be Shipped:   Infectious Disease: Vosevi    Other medication(s) to be shipped: n/a     Madison Fletcher, DOB: 1986/04/24  Phone: [phone] (home)       All above HIPAA information was verified with patient.     Completed refill call assessment today to schedule patient's medication shipment from the [facility] [phone]).       Specialty medication(s) and dose(s) confirmed: Regimen is correct and unchanged.   Changes to medications: Madison Fletcher reports no changes reported at this time.  Changes to insurance: No  Questions for the pharmacist: No    The patient will receive a drug information handout for each medication shipped and additional FDA Medication Guides as required.      DISEASE/MEDICATION-SPECIFIC INFORMATION        For Hepatitis C patients:  Treatment start date: 11/7  Current treatment week: 5    SPECIALTY MEDICATION ADHERENCE     Medication Adherence         Other adherence tool:  Medication is handed out at [facility] at 6:00 PM   Support network for adherence:  healthcare provider              vosevi tablets: patient isn't sure how much she has on hand- it is given to her daily by an aide      SHIPPING     Shipping address confirmed in Epic.     Delivery Scheduled: Yes, Expected medication delivery date: 12/20.     Medication will be delivered via Next Day Courier to the home address in Epic WAM.    Madison Fletcher   [facility] Specialty Technician

## 2018-10-05 ENCOUNTER — Ambulatory Visit
Admit: 2018-10-05 | Discharge: 2018-10-06 | Payer: MEDICAID | Attending: Clinical Neuropsychologist | Primary: Clinical Neuropsychologist

## 2018-10-05 DIAGNOSIS — F902 Attention-deficit hyperactivity disorder, combined type: Principal | ICD-10-CM

## 2018-10-06 NOTE — Unmapped (Addendum)
Reason for call: Pt canceled apt on 09/29/18. Pt needs labs for continuation PA in order for Vosevi to be shipped out    Hep C  Genotype: 3 on 06/17/18  Treatment: Vosevi x 12 wks  Start date: 08/25/18  Fibrosis:F0 on Fibroscan on 02/12/14  TW # 6    Pt missed her TW # 4 apt with Provider on 09/29/18. Pt needs TW # 4 safety labs for continuation PA for Medicaid in order to receive her last 4 wks of Vosevi. I called pt at phone number [phone] and left a VM with request for pt to return call. I need to coordinate labs to be done a.s.a.p to prevent any gaps in treatment.     October 06, 2018 3:54 PM  I called pt at phone number [phone] and left another VM. I also called pt at [facility] because she has a group until 3 pm. Pt provided their phone number for back up [phone]. I spoke with CJ who takes pt to her appointments. CJ stated she will bring the pt to the hospital tomorrow for labs and will ensure this gets done. Provided my contact number if they have any questions.       Vertell Limber RN, BSN  Nursing Care Coordinator   Pharmacy Adult GI Medicine  [facility]  [address]   [place], [place] [address]  [phone]                    Vertell Limber RN, BSN  Nursing Care Coordinator   Pharmacy Adult GI Medicine  [facility]  [address]   [place], [place] [address]  [phone]

## 2018-10-06 NOTE — Unmapped (Signed)
Orders Only:  10/06/18   Madison Fletcher  DOB:[birth year]     Pt is participating in [facility] Residential treatment program.  UDS monitored by [facility]' staff.        --Berneda Rose Suboxone (buprenorphine-naloxone) 8-2 mg sublingual films: as directed 1.5 films daily  -- today prescription for 28 days supply (#42 films) with no refills has been sent to [facility], to cover 12/30-1/27.    Pt missed her appointment 12/2 and this will need to be r/s asap.      Zelphia Cairo, MD

## 2018-10-06 NOTE — Unmapped (Signed)
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 delayed list recall and recognition, and worst on delayed figure recall.       ? Across tests of executive function, Ms. Borski???s scores ranged from borderline to average. On tests of inhibition and switching (D-KEFS Color-Word Interference Test), she scored in the borderline range. On tests of verbal fluency (COWAT Phonemic Fluency and COWAT Category Fluency), her scores were average. On a test of set-shifting (Trails B), she scored in the average range.        ? On a self-report, retrospective measure of childhood symptoms of ADHD, Ms. Mccreedy's endorsed symptoms placed her score above the suggested cutoff. On a measure assessing adult symptoms that are most predictive of ADHD, her score was also above the suggested cutoff, and she endorsed symptoms of both inattention and hyperactivity.          Overall, test results related to a diagnosis of ADHD suggest symptoms and deficits consistent with ADHD. Ms. Kerney???s scores in general intellectual ability such as verbal comprehension and  perceptual reasoning were higher than her scores in some ADHD-related areas, particularly on tests with interference involving inhibition or task-switching (including both executive function and working memory tasks). This split in her scores suggests relative deficits in working memory and executive function. Given Ms. Shipley's executive deficits in the current evaluation, she is likely to have difficulty with impulsivity, and in the context of distractions or divided attention, her ability to focus or restrain her behavior is reduced significantly. Ms. Rish was also able to give running commentary on her performance after most tests, supporting her difficulty in focusing and concentrating even when working directly with the examiner on timed tests. Furthermore, on measures of both childhood and adult symptoms of ADHD, her endorsed symptoms were consistent with ADHD, and she reported problems with both inattention and hyperactivity. Behaviorally, she showed frequent fidgeting throughout the assessment (jiggling one or both legs and picking at her fingers, suggesting hyperactivity). Her reported symptoms are interfering with her social functioning, her treatment, and her academic work.    On the other hand, in terms of global cognitive deficits, overall Ms. Engelson's RBANS scores are consistent with her general intellectual functioning (low average to average) and do not show notable deficits. The only exception was her borderline immediate memory score, which could also represent difficulty focusing on tasks initially. Given that she was able to perform better on the same content after a 25-minute delay, she appears to be encoding information well, so in general memory (other than working memory involving divided attention) was not impaired. On her lowest delayed memory score (RBANS Figure Recall, moderately impaired), Ms. Pendergraph lost points for careless drawing rather than lack of recall. Also, despite Ms. Neels's protestations that she had difficulty pronouncing words, she was able to read headlines and paragraphs well from a magazine (although she said that she did not retain the content and could not have done so on her own, as her attention would have wandered.) She also had relatively good verbal fluency, although her vocabulary was limited (in testing). She did not appear to have language processing deficits apart from general problems with focus and concentration.     Taken as a whole, Ms. Tukes???s presentation appears consistent with a diagnosis of ADHD, combined presentation. Her history of trauma, drug abuse, alcohol abuse, and hepatitis C have likely contributed to her current issues with attention and concentration, but overall her deficits appear developmental.    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 Given her results regarding ADHD, Ms. Kunde would benefit from a trial of ADHD medication, if possible followed by brief repeat testing once medication is established. If medication is improving her symptoms, she should show a much-improved neurocognitive profile.      2. Although Ms. Montilla said that she did not like meditation, mindfulness training (available in person or online) can also be helpful in improving attention and focus.         3. Dietary changes may alleviate ADHD symptoms. Taking omega-3 supplements (particularly natural fish oil, which is absorbed well and more resistant to oxidation than processed oils) may be helpful. Also, some studies indicate that certain food colorings and preservatives may increase hyperactive behavior. The best approach for overall health and nutrition is a diet that limits sugary and processed foods and is rich in fruits, vegetables, grains, and healthy fats such as omega-3 fatty acids found in certain types of fish, flaxseed, and other foods.  4. Avoiding known food allergens may also decrease ADHD symptoms. In addition, avoiding these foods may improve symptoms:    a. chemical additives/preservatives such as BHT (butylated hydroxytoluene) and BHA (butylated hydroxyanisole), which are often used to keep the oil in a product from going bad and can be found in processed food items such as potato chips, chewing gum, dry cake mixes, cereal, butter, and instant mashed potatoes   b. milk and eggs  c. chocolate  d. foods containing salicylates, including berries, chili powder, apples and cider, grapes, oranges, peaches, plums, prunes, and tomatoes (salicylates are chemicals occurring naturally in plants and are the major ingredient in many pain medications).      5. Spending time outside may improve symptoms of ADHD. Strong evidence indicates that spending even 20 minutes outside can improve concentration. Greenery and nature settings are the most beneficial.       6. Treatment with supplements such as zinc, L-carnitine, vitamin B-6, and magnesium may help improve symptoms of ADHD. However, results have been mixed. Medical advice is suggested before trying these alternative therapies.       Thank you for the opportunity to participate in Ms. Wares???s care. If I can help further, please contact me at [phone].      ________________________________   Donnie Aho, Ph.D.  Clinical Assistant Professor  School of Medicine, Department of Psychiatry  [place] [facility], [place] [address]    Neuropsychological Testing Summary  Effort Score  Interpretation   Rey-15 Recall raw= 15 of 15 Effort Adequate   Rey-15 Recognition raw= 15 of 15           Intellectual Functioning Score Percentile Range   WAIS-IV Vocabulary ss= 8 25 Average   WAIS-IV Matrix Reasoning ss= 7 16 Low Average   WAIS-IV Digit Span ss= 9 37 Average   WAIS-IV Coding ss= 6 9 Low Average          General Cognitive Function Score Percentile Range   RBANS Index Score Index= 82 12 Low Average          Attention/Processing Speed t Score Percentile Range   Trails A t= 61 86 High Average   RBANS Attention Index= 94 34 Average      RBANS Digit Span ss= 10 50 Average      RBANS Coding ss= 8 25 Average   D-KEFS Color-Word: Color Naming ss= 9 37 Average   D-KEFS Color-Word: Word Reading ss= 13 84 High Average   WAIS-IV Coding ss= 61 9 Low Average          Language Score Percentile Range   RBANS Language Index= 87 19 Low Average      RBANS Picture Naming %ile Group= 17-25 Average      RBANS Semantic Fluency ss= 5 5 Borderline   COWAT Phonemic Fluency t= 48 42 Average   COWAT Semantic Fluency t= 54 66 Average          Visuospatial Skills Score Percentile Range   RBANS Visuospatial/Constructional Index= 92 30 Average      RBANS Figure Copy ss= 9 37 Average      RBANS Line Orientation %ile Group= 26-50 Average          Working Memory Score Percentile Range   ACT 9-Second t= 14 <0.1 Profoundly Impaired   ACT 18-Second t= 22 0.2 Profoundly Impaired   ACT 36-Second t= 37 9 Low Average   WAIS-IV Digit Span ss= 9 37 Average  Memory Score Percentile Range   RBANS Immediate Memory Index= 76 5 Borderline      RBANS List Learning ss= 6 9 Low Average      RBANS Story Memory ss= 5 5 Borderline   RBANS Delayed Memory Index= 83 13 Low Average      RBANS List Recall %ile Group= 51-75 Average      RBANS List Recognition %ile Group= 51-75 Average      RBANS Story Recall ss= 6 9 Low Average      RBANS Figure Recall ss= 3 1 Moderately Impaired          Executive Function Score Percentile Range   Trails B t= 44 25 Average   COWAT Phonemic Fluency t= 48 42 Average   COWAT Semantic Fluency t= 54 66 Average   D-KEFS Color-Word: Inhibition ss= 5 5 Borderline   D-KEFS Color-Word: Inhibition/Switching ss= 5 5 Borderline       Neuropsychological Testing Units CPT Code Date Start  Stop  Units   Neurobehavioral status exam, 60 96116 10/05/2018 9:00 10:00 1   Neuropsychological test administration and scoring, 30 96136 10/05/2018 10:00 10:30 1   Neuropsychological test administration and scoring, +30  16109 10/05/2018 10:30 1:00 5   Neuropsychological testing evaluation services, 60 96132 10/05/2018 6:00 7:00 1   Neuropsychological testing evaluation services, +60 96133 10/05/2018 7:00 10:00 3

## 2018-10-06 NOTE — Unmapped (Signed)
Madison Fletcher 's Vosevi shipment will be delayed due to Prior Authorization Required We have contacted the patient and left a message We will call the patient to reschedule the delivery upon resolution. We have confirmed the delivery date as n/a .

## 2018-10-07 ENCOUNTER — Ambulatory Visit: Admit: 2018-10-07 | Discharge: 2018-10-08 | Payer: MEDICAID

## 2018-10-07 DIAGNOSIS — B182 Chronic viral hepatitis C: Principal | ICD-10-CM

## 2018-10-07 DIAGNOSIS — N898 Other specified noninflammatory disorders of vagina: Secondary | ICD-10-CM

## 2018-10-07 LAB — CBC W/ AUTO DIFF
BASOPHILS ABSOLUTE COUNT: 0 10*9/L (ref 0.0–0.1)
BASOPHILS RELATIVE PERCENT: 0.6 %
EOSINOPHILS ABSOLUTE COUNT: 0.1 10*9/L (ref 0.0–0.4)
EOSINOPHILS RELATIVE PERCENT: 1.6 %
HEMATOCRIT: 42.9 % (ref 36.0–46.0)
HEMOGLOBIN: 13.6 g/dL (ref 12.0–16.0)
LARGE UNSTAINED CELLS: 2 % (ref 0–4)
LYMPHOCYTES ABSOLUTE COUNT: 3.3 10*9/L (ref 1.5–5.0)
MEAN CORPUSCULAR HEMOGLOBIN CONC: 31.7 g/dL (ref 31.0–37.0)
MEAN CORPUSCULAR HEMOGLOBIN: 29.8 pg (ref 26.0–34.0)
MEAN CORPUSCULAR VOLUME: 93.9 fL (ref 80.0–100.0)
MEAN PLATELET VOLUME: 7.6 fL (ref 7.0–10.0)
MONOCYTES ABSOLUTE COUNT: 0.3 10*9/L (ref 0.2–0.8)
MONOCYTES RELATIVE PERCENT: 4.4 %
NEUTROPHILS ABSOLUTE COUNT: 3.8 10*9/L (ref 2.0–7.5)
NEUTROPHILS RELATIVE PERCENT: 48.7 %
PLATELET COUNT: 324 10*9/L (ref 150–440)
RED CELL DISTRIBUTION WIDTH: 12.9 % (ref 12.0–15.0)
WBC ADJUSTED: 7.8 10*9/L (ref 4.5–11.0)

## 2018-10-07 LAB — HEPATIC FUNCTION PANEL
ALBUMIN: 3.9 g/dL (ref 3.5–5.0)
ALKALINE PHOSPHATASE: 105 U/L (ref 38–126)
AST (SGOT): 16 U/L (ref 14–38)
BILIRUBIN DIRECT: <0.1 mg/dL (ref 0.00–0.40)

## 2018-10-07 LAB — ALBUMIN: Albumin:MCnc:Pt:Ser/Plas:Qn:: 3.9

## 2018-10-07 LAB — LARGE UNSTAINED CELLS: Lab: 2

## 2018-10-07 LAB — CREATININE: EGFR CKD-EPI NON-AA FEMALE: >90 mL/min/{1.73_m2} (ref >=60)

## 2018-10-07 LAB — BLOOD UREA NITROGEN: Urea nitrogen:MCnc:Pt:Ser/Plas:Qn:: 11

## 2018-10-07 LAB — EGFR CKD-EPI AA FEMALE: Lab: >90

## 2018-10-10 LAB — HIV ANTIGEN/ANTIBODY COMBO: HIV 1+2 Ab+HIV1 p24 Ag:PrThr:Pt:Ser/Plas:Ord:IA: NONREACTIVE

## 2018-10-10 LAB — SYPHILIS RPR SCREEN: Reagin Ab:PrThr:Pt:Ser:Ord:RPR: NONREACTIVE

## 2018-10-10 LAB — HEPATITIS C RNA, QUANTITATIVE, PCR: HCV RNA(IU): <12 [IU]/mL — ABNORMAL HIGH (ref <=0)

## 2018-10-10 LAB — HCV RNA LOG10: Lab: 0

## 2018-10-13 NOTE — Unmapped (Signed)
Follow-Up Counseling for HCV Treatment      Regimen: Vosevi (sofosbuvir/velpatasvir/voxilaprevir 400/100/100mg ) x 12 weeks  Start Date: 08/25/18  Treatment Week #7    Pharmacy: [facility] [phone]     Following topics were reviewed during the phone call:  Patient Counseling    Counseled the patient on the following:  doses and administration discussed, possible adverse effects and management discussed, possible drug and prescription drug interactions discussed, possible drug and OTC drug and food interactions discussed, lab monitoring and follow-up discussed, therapeutic rationale discussed, adherence and missed doses discussed, pharmacy contact information discussed         1. Medication administration - Takes Vosevi 1 tablet daily with food every night at 6:00 PM. Medication is handed out by [employer].     2. Importance of adherence -   Medication Adherence    Patient Reported X Missed Doses in the Last Month:  0  Specialty Medication:  Vosevi  Patient is on additional specialty medications:  No  Patient is on more than two specialty medications:  No  Gaps in Refill History Greater than 2 Weeks in the Last 3 Months:  No  Demonstrates Understanding of Importance of Adherence:  Yes  Informant:  patient  Reliability of Informant:  reliable  Provider-Estimated Medication Adherence Level:  90-100%  Patient is at risk for Non-Adherence:  No   Reasons for Non-Adherence:  no problems identified  Other Adherence Tool:  Medication is handed out at [employer] at 6:00 PM  Support Network for Adherence:  healthcare provider  Confirmed Plan for Next Specialty Medication Refill:  delivery by pharmacy  Refills Needed for Supportive Medications:  not needed  Medication Assistance Program  Has Prior Authorization Been Obtained:  Yes  Was Copay Assistance Provided:  No  Has the Patient Been informed of Copay Responsibility:  Yes  Was Kennedy Bucker Assistance Provided:  No  Refill Coordination  Has the Patients' Contact Information Changed:  No    Is the Shipping Address Different:  No    Shipping Information  Delivery Scheduled:  Yes  Delivery Date:  10/14/18  Medications to be Shipped:  Vosevi       Pt is unable to provide pill count as it is kept by the [employer]. Pt denies missing any doses.     3. Side effects - No new side effects to report.  Adverse Effects        *All other systems reviewed and are negative         4. Drug-drug interaction - Denies any medication changes. Denies any OTC use. Pt denies any alcohol.  Drug Interactions    Drug interactions evaluated:  yes  Clinically relevant drug interactions identified:  yes   Interactions list:  Omeprazole 20mg  - may decrease efficacy of Vosevi   Drug management plan:   We discussed the mechanism of drug-drug interaction with acid lowering agents such as omeprazole may decrease the absorption of Vosevi.  Instructed that if pt must take omeprazole, pt may take max dose of 20mg  at the same time as Vosevi.               5. Follow up - Has follow up appointment scheduled in HCV treatment clinic on 11/08/18 @ 1230 with Owens Shark, DNP. Informed patient that there is still a small chance of relapse after finishing the treatment. Stressed importance of follow up 3 months post treatment to assess for cure. Confirmed shipping address with pt:  [address] [place] [address]. Notified pt I will request courier delivery for tomorrow 10/14/18.    Refill Coordination    Has the Patients' Contact Information Changed:  No  Is the Shipping Address Different:  No       Shipping Information    Delivery Scheduled:  Yes  Delivery Date:  10/14/18  Medications to be Shipped:  Vosevi         All questions were answered.      Vertell Limber RN, [facility]   Pharmacy Adult GI Medicine  [facility]  [address]   [place], [place] [address]  [phone]    October 13, 2018 3:21 PM

## 2018-10-14 MED FILL — VOSEVI 400 MG-100 MG-100 MG TABLET: ORAL | 28 days supply | Qty: 28 | Fill #2

## 2018-10-14 MED FILL — VOSEVI 400 MG-100 MG-100 MG TABLET: 28 days supply | Qty: 28 | Fill #2 | Status: AC

## 2018-10-17 MED ORDER — BUPRENORPHINE 8 MG-NALOXONE 2 MG SUBLINGUAL FILM
ORAL_FILM | SUBLINGUAL | 0 refills | 0.00000 days | Status: CP
Start: 2018-10-17 — End: 2018-11-14

## 2018-10-31 MED ORDER — LAMOTRIGINE 100 MG TABLET
ORAL_TABLET | Freq: Every day | ORAL | 0 refills | 0 days | Status: CP
Start: 2018-10-31 — End: 2018-11-21

## 2018-10-31 MED ORDER — QUETIAPINE 25 MG TABLET
ORAL_TABLET | Freq: Every evening | ORAL | 0 refills | 0.00000 days | Status: CP | PRN
Start: 2018-10-31 — End: 2018-11-21

## 2018-10-31 MED ORDER — VENLAFAXINE ER 75 MG CAPSULE,EXTENDED RELEASE 24 HR
ORAL_CAPSULE | Freq: Every day | ORAL | 0 refills | 0.00000 days | Status: CP
Start: 2018-10-31 — End: 2018-11-21

## 2018-10-31 MED ORDER — BUPROPION HCL XL 150 MG 24 HR TABLET, EXTENDED RELEASE
ORAL_TABLET | Freq: Every day | ORAL | 0 refills | 0 days | Status: CP
Start: 2018-10-31 — End: 2018-11-21

## 2018-11-01 MED ORDER — NALOXONE 4 MG/ACTUATION NASAL SPRAY
0 refills | 0 days | Status: CP
Start: 2018-11-01 — End: ?

## 2018-11-01 NOTE — Unmapped (Signed)
Patient with diagnosis of opioid use disorder. Is preparing to move out of residential treatment. Per protocol, will send with Narcan.

## 2018-11-01 NOTE — Unmapped (Signed)
Orders Only:  10/31/18   Madison Fletcher  DOB:1986/07/01     Pt is participating in [facility] Residential treatment program.  UDS monitored by [employer]' staff.      I have renewed her Wellbutrin, Seroquel,  Lamictal and venlafaxine XR x 30 days with no rf.       RTC 11/21/2018      Zelphia Cairo, MD

## 2018-11-08 ENCOUNTER — Ambulatory Visit: Admit: 2018-11-08 | Discharge: 2018-11-09 | Payer: MEDICAID | Attending: Family | Primary: Family

## 2018-11-08 DIAGNOSIS — Z3200 Encounter for pregnancy test, result unknown: Secondary | ICD-10-CM

## 2018-11-08 DIAGNOSIS — B182 Chronic viral hepatitis C: Principal | ICD-10-CM

## 2018-11-08 MED ORDER — CLINDAMYCIN 1 %-BENZOYL PEROXIDE 5 % TOPICAL GEL WITH PUMP
5 refills | 0 days | Status: CP
Start: 2018-11-08 — End: ?

## 2018-11-14 MED ORDER — BUPRENORPHINE 8 MG-NALOXONE 2 MG SUBLINGUAL FILM
ORAL_FILM | SUBLINGUAL | 0 refills | 0 days | Status: CP
Start: 2018-11-14 — End: 2018-11-24

## 2018-11-24 MED ORDER — BUPRENORPHINE 8 MG-NALOXONE 2 MG SUBLINGUAL FILM
ORAL_FILM | SUBLINGUAL | 0 refills | 0.00000 days | Status: CP
Start: 2018-11-24 — End: 2018-11-25

## 2018-11-25 MED ORDER — BUPRENORPHINE 8 MG-NALOXONE 2 MG SUBLINGUAL FILM
ORAL_FILM | Freq: Two times a day (BID) | SUBLINGUAL | 0 refills | 0 days | Status: CP
Start: 2018-11-25 — End: 2018-12-01

## 2018-11-30 MED ORDER — BUPROPION HCL XL 150 MG 24 HR TABLET, EXTENDED RELEASE
ORAL_TABLET | Freq: Every day | ORAL | 0 refills | 0.00000 days | Status: CP
Start: 2018-11-30 — End: 2019-01-02

## 2018-11-30 MED ORDER — QUETIAPINE 25 MG TABLET
ORAL_TABLET | Freq: Every evening | ORAL | 0 refills | 0.00000 days | Status: CP | PRN
Start: 2018-11-30 — End: 2019-01-02

## 2018-11-30 MED ORDER — ATOMOXETINE 40 MG CAPSULE
ORAL_CAPSULE | Freq: Every day | ORAL | 0 refills | 0 days | Status: CP
Start: 2018-11-30 — End: 2018-12-26

## 2018-11-30 MED ORDER — LAMOTRIGINE 100 MG TABLET
ORAL_TABLET | Freq: Every day | ORAL | 0 refills | 0 days | Status: CP
Start: 2018-11-30 — End: 2019-01-02

## 2018-11-30 MED ORDER — VENLAFAXINE ER 75 MG CAPSULE,EXTENDED RELEASE 24 HR
ORAL_CAPSULE | Freq: Every day | ORAL | 0 refills | 0 days | Status: CP
Start: 2018-11-30 — End: 2019-01-02

## 2018-12-01 MED ORDER — BUPRENORPHINE 8 MG-NALOXONE 2 MG SUBLINGUAL FILM: 1 | Film | Freq: Two times a day (BID) | 0 refills | 0 days | Status: AC

## 2018-12-01 MED ORDER — BUPRENORPHINE 8 MG-NALOXONE 2 MG SUBLINGUAL FILM
ORAL_FILM | Freq: Two times a day (BID) | SUBLINGUAL | 0 refills | 0.00000 days | Status: CP
Start: 2018-12-01 — End: 2018-12-08

## 2018-12-08 MED ORDER — BUPRENORPHINE 8 MG-NALOXONE 2 MG SUBLINGUAL FILM
ORAL_FILM | Freq: Two times a day (BID) | SUBLINGUAL | 0 refills | 0 days | Status: CP
Start: 2018-12-08 — End: 2018-12-15

## 2018-12-15 MED ORDER — BUPRENORPHINE 8 MG-NALOXONE 2 MG SUBLINGUAL FILM
ORAL_FILM | Freq: Two times a day (BID) | SUBLINGUAL | 0 refills | 0 days | Status: CP
Start: 2018-12-15 — End: 2018-12-22

## 2018-12-22 DIAGNOSIS — F1120 Opioid dependence, uncomplicated: Principal | ICD-10-CM

## 2018-12-22 MED ORDER — BUPRENORPHINE 8 MG-NALOXONE 2 MG SUBLINGUAL FILM
ORAL_FILM | Freq: Two times a day (BID) | SUBLINGUAL | 0 refills | 0 days | Status: CP
Start: 2018-12-22 — End: 2018-12-29

## 2018-12-26 DIAGNOSIS — F902 Attention-deficit hyperactivity disorder, combined type: Principal | ICD-10-CM

## 2018-12-26 MED ORDER — ATOMOXETINE 40 MG CAPSULE
ORAL_CAPSULE | Freq: Every day | ORAL | 0 refills | 0 days | Status: CP
Start: 2018-12-26 — End: 2019-01-30

## 2018-12-29 DIAGNOSIS — F1120 Opioid dependence, uncomplicated: Principal | ICD-10-CM

## 2018-12-29 MED ORDER — BUPRENORPHINE 8 MG-NALOXONE 2 MG SUBLINGUAL FILM
ORAL_FILM | Freq: Two times a day (BID) | SUBLINGUAL | 0 refills | 0 days | Status: CP
Start: 2018-12-29 — End: 2019-01-06

## 2019-01-02 DIAGNOSIS — F419 Anxiety disorder, unspecified: Principal | ICD-10-CM

## 2019-01-02 DIAGNOSIS — F4310 Post-traumatic stress disorder, unspecified: Principal | ICD-10-CM

## 2019-01-02 DIAGNOSIS — F329 Major depressive disorder, single episode, unspecified: Principal | ICD-10-CM

## 2019-01-02 DIAGNOSIS — Z720 Tobacco use: Principal | ICD-10-CM

## 2019-01-02 DIAGNOSIS — G4700 Insomnia, unspecified: Principal | ICD-10-CM

## 2019-01-02 MED ORDER — QUETIAPINE 25 MG TABLET
ORAL_TABLET | Freq: Every evening | ORAL | 0 refills | 0.00000 days | Status: CP | PRN
Start: 2019-01-02 — End: 2019-01-30

## 2019-01-02 MED ORDER — BUPROPION HCL XL 150 MG 24 HR TABLET, EXTENDED RELEASE
ORAL_TABLET | Freq: Every day | ORAL | 0 refills | 0 days | Status: CP
Start: 2019-01-02 — End: 2019-01-30

## 2019-01-02 MED ORDER — VENLAFAXINE ER 75 MG CAPSULE,EXTENDED RELEASE 24 HR
ORAL_CAPSULE | Freq: Every day | ORAL | 0 refills | 0.00000 days | Status: CP
Start: 2019-01-02 — End: 2019-01-30

## 2019-01-02 MED ORDER — LAMOTRIGINE 100 MG TABLET
ORAL_TABLET | Freq: Every day | ORAL | 0 refills | 0.00000 days | Status: CP
Start: 2019-01-02 — End: 2019-01-30

## 2019-01-06 DIAGNOSIS — F1120 Opioid dependence, uncomplicated: Principal | ICD-10-CM

## 2019-01-06 MED ORDER — BUPRENORPHINE 8 MG-NALOXONE 2 MG SUBLINGUAL FILM
ORAL_FILM | Freq: Two times a day (BID) | SUBLINGUAL | 0 refills | 0 days | Status: CP
Start: 2019-01-06 — End: 2019-01-16

## 2019-01-16 DIAGNOSIS — F1120 Opioid dependence, uncomplicated: Principal | ICD-10-CM

## 2019-01-16 MED ORDER — BUPRENORPHINE 8 MG-NALOXONE 2 MG SUBLINGUAL FILM
ORAL_FILM | Freq: Two times a day (BID) | SUBLINGUAL | 0 refills | 0.00000 days | Status: CP
Start: 2019-01-16 — End: 2019-01-24

## 2019-01-24 MED ORDER — BUPRENORPHINE 8 MG-NALOXONE 2 MG SUBLINGUAL FILM
ORAL_FILM | Freq: Two times a day (BID) | SUBLINGUAL | 0 refills | 0 days | Status: CP
Start: 2019-01-24 — End: 2019-01-30

## 2019-01-30 MED ORDER — LAMOTRIGINE 100 MG TABLET
ORAL_TABLET | Freq: Every day | ORAL | 0 refills | 0 days | Status: CP
Start: 2019-01-30 — End: 2019-02-27

## 2019-01-30 MED ORDER — BUPROPION HCL XL 150 MG 24 HR TABLET, EXTENDED RELEASE
ORAL_TABLET | Freq: Every day | ORAL | 0 refills | 0 days | Status: CP
Start: 2019-01-30 — End: 2019-02-27

## 2019-01-30 MED ORDER — BUPRENORPHINE 8 MG-NALOXONE 2 MG SUBLINGUAL FILM
ORAL_FILM | Freq: Two times a day (BID) | SUBLINGUAL | 0 refills | 0 days | Status: CP
Start: 2019-01-30 — End: 2019-02-06

## 2019-01-30 MED ORDER — VENLAFAXINE ER 75 MG CAPSULE,EXTENDED RELEASE 24 HR
ORAL_CAPSULE | Freq: Every day | ORAL | 0 refills | 0.00000 days | Status: CP
Start: 2019-01-30 — End: 2019-02-27

## 2019-01-30 MED ORDER — QUETIAPINE 25 MG TABLET
ORAL_TABLET | Freq: Every evening | ORAL | 0 refills | 0.00000 days | Status: CP | PRN
Start: 2019-01-30 — End: 2019-02-27

## 2019-01-30 MED ORDER — ATOMOXETINE 40 MG CAPSULE
ORAL_CAPSULE | Freq: Every day | ORAL | 0 refills | 0.00000 days | Status: CP
Start: 2019-01-30 — End: 2019-02-27

## 2019-02-06 MED ORDER — BUPRENORPHINE 8 MG-NALOXONE 2 MG SUBLINGUAL FILM
ORAL_FILM | Freq: Two times a day (BID) | SUBLINGUAL | 0 refills | 0.00000 days | Status: CP
Start: 2019-02-06 — End: 2019-02-13

## 2019-02-13 MED ORDER — BUPRENORPHINE 8 MG-NALOXONE 2 MG SUBLINGUAL FILM
ORAL_FILM | Freq: Two times a day (BID) | SUBLINGUAL | 0 refills | 0.00000 days | Status: CP
Start: 2019-02-13 — End: 2019-02-20

## 2019-02-14 ENCOUNTER — Institutional Professional Consult (permissible substitution): Admit: 2019-02-14 | Discharge: 2019-02-15 | Payer: MEDICAID | Attending: Family | Primary: Family

## 2019-02-20 MED ORDER — BUPRENORPHINE 8 MG-NALOXONE 2 MG SUBLINGUAL FILM
ORAL_FILM | SUBLINGUAL | 0 refills | 0 days | Status: CP
Start: 2019-02-20 — End: 2019-02-27

## 2019-02-27 MED ORDER — ATOMOXETINE 60 MG CAPSULE
ORAL_CAPSULE | Freq: Every day | ORAL | 0 refills | 0 days | Status: CP
Start: 2019-02-27 — End: 2019-03-03

## 2019-02-27 MED ORDER — BUPRENORPHINE 8 MG-NALOXONE 2 MG SUBLINGUAL FILM
ORAL_FILM | SUBLINGUAL | 0 refills | 0 days | Status: CP
Start: 2019-02-27 — End: 2019-03-03

## 2019-02-27 MED ORDER — QUETIAPINE 25 MG TABLET
ORAL_TABLET | Freq: Every evening | ORAL | 0 refills | 0.00000 days | Status: CP | PRN
Start: 2019-02-27 — End: 2019-03-03

## 2019-03-01 MED ORDER — VENLAFAXINE ER 75 MG CAPSULE,EXTENDED RELEASE 24 HR
ORAL_CAPSULE | Freq: Every day | ORAL | 0 refills | 0.00000 days | Status: CP
Start: 2019-03-01 — End: 2019-03-03

## 2019-03-01 MED ORDER — BUPROPION HCL XL 150 MG 24 HR TABLET, EXTENDED RELEASE
ORAL_TABLET | Freq: Every day | ORAL | 0 refills | 0 days | Status: CP
Start: 2019-03-01 — End: 2019-03-03

## 2019-03-01 MED ORDER — LAMOTRIGINE 100 MG TABLET
ORAL_TABLET | Freq: Every day | ORAL | 0 refills | 0.00000 days | Status: CP
Start: 2019-03-01 — End: 2019-03-03

## 2019-03-03 MED ORDER — BUPRENORPHINE 8 MG-NALOXONE 2 MG SUBLINGUAL FILM
ORAL_FILM | SUBLINGUAL | 0 refills | 0 days | Status: CP
Start: 2019-03-03 — End: 2019-03-09

## 2019-03-03 MED ORDER — ATOMOXETINE 60 MG CAPSULE
ORAL_CAPSULE | Freq: Every day | ORAL | 0 refills | 0 days | Status: CP
Start: 2019-03-03 — End: 2019-03-27

## 2019-03-03 MED ORDER — LAMOTRIGINE 100 MG TABLET
ORAL_TABLET | Freq: Every day | ORAL | 0 refills | 0.00000 days | Status: CP
Start: 2019-03-03 — End: 2019-03-27

## 2019-03-03 MED ORDER — BUPROPION HCL XL 150 MG 24 HR TABLET, EXTENDED RELEASE
ORAL_TABLET | Freq: Every day | ORAL | 0 refills | 0 days | Status: CP
Start: 2019-03-03 — End: 2019-03-27

## 2019-03-03 MED ORDER — VENLAFAXINE ER 75 MG CAPSULE,EXTENDED RELEASE 24 HR
ORAL_CAPSULE | Freq: Every day | ORAL | 0 refills | 0.00000 days | Status: CP
Start: 2019-03-03 — End: 2019-03-27

## 2019-03-03 MED ORDER — QUETIAPINE 25 MG TABLET
ORAL_TABLET | Freq: Every evening | ORAL | 0 refills | 0.00000 days | Status: CP | PRN
Start: 2019-03-03 — End: 2019-03-27

## 2019-03-09 MED ORDER — BUPRENORPHINE 8 MG-NALOXONE 2 MG SUBLINGUAL FILM
ORAL_FILM | SUBLINGUAL | 0 refills | 0 days | Status: CP
Start: 2019-03-09 — End: 2019-03-16

## 2019-03-16 MED ORDER — BUPRENORPHINE 8 MG-NALOXONE 2 MG SUBLINGUAL FILM
ORAL_FILM | SUBLINGUAL | 0 refills | 0 days | Status: CP
Start: 2019-03-16 — End: 2019-03-23

## 2019-03-23 MED ORDER — BUPRENORPHINE 8 MG-NALOXONE 2 MG SUBLINGUAL FILM
ORAL_FILM | SUBLINGUAL | 0 refills | 0 days | Status: CP
Start: 2019-03-23 — End: 2019-03-30

## 2019-03-30 MED ORDER — BUPRENORPHINE 8 MG-NALOXONE 2 MG SUBLINGUAL FILM
ORAL_FILM | SUBLINGUAL | 0 refills | 0.00000 days | Status: CP
Start: 2019-03-30 — End: 2019-04-06

## 2019-04-02 MED ORDER — VENLAFAXINE ER 75 MG CAPSULE,EXTENDED RELEASE 24 HR
ORAL_CAPSULE | Freq: Every day | ORAL | 0 refills | 0.00000 days | Status: CP
Start: 2019-04-02 — End: 2019-04-24

## 2019-04-02 MED ORDER — BUPROPION HCL XL 150 MG 24 HR TABLET, EXTENDED RELEASE
ORAL_TABLET | Freq: Every day | ORAL | 0 refills | 0.00000 days | Status: CP
Start: 2019-04-02 — End: 2019-04-24

## 2019-04-02 MED ORDER — QUETIAPINE 25 MG TABLET
ORAL_TABLET | Freq: Every evening | ORAL | 0 refills | 0.00000 days | Status: CP | PRN
Start: 2019-04-02 — End: 2019-04-24

## 2019-04-02 MED ORDER — ATOMOXETINE 60 MG CAPSULE
ORAL_CAPSULE | Freq: Every day | ORAL | 0 refills | 0.00000 days | Status: CP
Start: 2019-04-02 — End: 2019-04-24

## 2019-04-02 MED ORDER — LAMOTRIGINE 100 MG TABLET
ORAL_TABLET | Freq: Every day | ORAL | 0 refills | 0 days | Status: CP
Start: 2019-04-02 — End: 2019-04-24

## 2019-04-05 ENCOUNTER — Ambulatory Visit
Admit: 2019-04-05 | Discharge: 2019-04-06 | Payer: MEDICAID | Attending: Student in an Organized Health Care Education/Training Program | Primary: Student in an Organized Health Care Education/Training Program

## 2019-04-05 DIAGNOSIS — L700 Acne vulgaris: Secondary | ICD-10-CM

## 2019-04-05 DIAGNOSIS — N898 Other specified noninflammatory disorders of vagina: Secondary | ICD-10-CM

## 2019-04-05 DIAGNOSIS — R300 Dysuria: Principal | ICD-10-CM

## 2019-04-05 DIAGNOSIS — Z113 Encounter for screening for infections with a predominantly sexual mode of transmission: Secondary | ICD-10-CM

## 2019-04-05 DIAGNOSIS — B182 Chronic viral hepatitis C: Secondary | ICD-10-CM

## 2019-04-05 MED ORDER — LEVONORGESTREL 0.15 MG-ETHINYL ESTRADIOL 0.03 MG TABLET
ORAL_TABLET | Freq: Every day | ORAL | 3 refills | 0 days | Status: CP
Start: 2019-04-05 — End: 2020-04-04

## 2019-04-05 MED ORDER — TRETINOIN 0.01 % TOPICAL GEL
Freq: Every evening | TOPICAL | 0 refills | 0 days | Status: CP
Start: 2019-04-05 — End: 2020-04-04

## 2019-04-05 MED ORDER — DOXYCYCLINE HYCLATE 50 MG CAPSULE
ORAL_CAPSULE | Freq: Every day | ORAL | 0 refills | 0.00000 days | Status: CP
Start: 2019-04-05 — End: 2019-05-05

## 2019-04-06 MED ORDER — BUPRENORPHINE 8 MG-NALOXONE 2 MG SUBLINGUAL FILM
ORAL_FILM | SUBLINGUAL | 0 refills | 0 days | Status: CP
Start: 2019-04-06 — End: 2019-04-13

## 2019-04-07 MED ORDER — NITROFURANTOIN MONOHYDRATE/MACROCRYSTALS 100 MG CAPSULE
ORAL_CAPSULE | Freq: Two times a day (BID) | ORAL | 0 refills | 0 days | Status: CP
Start: 2019-04-07 — End: 2019-04-12

## 2019-04-13 MED ORDER — BUPRENORPHINE 8 MG-NALOXONE 2 MG SUBLINGUAL FILM
ORAL_FILM | SUBLINGUAL | 0 refills | 0 days | Status: CP
Start: 2019-04-13 — End: 2019-04-20

## 2019-04-18 ENCOUNTER — Institutional Professional Consult (permissible substitution): Admit: 2019-04-18 | Discharge: 2019-04-19 | Payer: MEDICAID | Attending: Family | Primary: Family

## 2019-04-20 MED ORDER — BUPRENORPHINE 8 MG-NALOXONE 2 MG SUBLINGUAL FILM
ORAL_FILM | SUBLINGUAL | 0 refills | 0.00000 days | Status: CP
Start: 2019-04-20 — End: 2019-04-27

## 2019-04-24 MED ORDER — QUETIAPINE 25 MG TABLET: 50 mg | tablet | Freq: Every evening | 0 refills | 0 days | Status: AC

## 2019-04-27 MED ORDER — BUPRENORPHINE 8 MG-NALOXONE 2 MG SUBLINGUAL FILM
ORAL_FILM | SUBLINGUAL | 0 refills | 0.00000 days | Status: CP
Start: 2019-04-27 — End: 2019-05-04

## 2019-05-02 MED ORDER — VENLAFAXINE ER 75 MG CAPSULE,EXTENDED RELEASE 24 HR
ORAL_CAPSULE | Freq: Every day | ORAL | 0 refills | 0 days | Status: CP
Start: 2019-05-02 — End: 2019-05-25

## 2019-05-02 MED ORDER — LAMOTRIGINE 100 MG TABLET
ORAL_TABLET | Freq: Every day | ORAL | 0 refills | 0 days | Status: CP
Start: 2019-05-02 — End: 2019-05-25

## 2019-05-02 MED ORDER — QUETIAPINE 25 MG TABLET
ORAL_TABLET | Freq: Every evening | ORAL | 0 refills | 0.00000 days | Status: CP | PRN
Start: 2019-05-02 — End: 2019-04-24

## 2019-05-02 MED ORDER — BUPROPION HCL XL 150 MG 24 HR TABLET, EXTENDED RELEASE
ORAL_TABLET | Freq: Every day | ORAL | 0 refills | 0.00000 days | Status: CP
Start: 2019-05-02 — End: 2019-05-25

## 2019-05-02 MED ORDER — ATOMOXETINE 60 MG CAPSULE
ORAL_CAPSULE | Freq: Every day | ORAL | 0 refills | 0 days | Status: CP
Start: 2019-05-02 — End: 2019-05-25

## 2019-05-04 MED ORDER — BUPRENORPHINE 8 MG-NALOXONE 2 MG SUBLINGUAL FILM
ORAL_FILM | SUBLINGUAL | 0 refills | 7 days | Status: CP
Start: 2019-05-04 — End: 2019-05-11

## 2019-05-11 MED ORDER — BUPRENORPHINE 8 MG-NALOXONE 2 MG SUBLINGUAL FILM
ORAL_FILM | SUBLINGUAL | 0 refills | 7.00000 days | Status: CP
Start: 2019-05-11 — End: 2019-05-19

## 2019-05-19 MED ORDER — BUPRENORPHINE 8 MG-NALOXONE 2 MG SUBLINGUAL FILM
ORAL_FILM | SUBLINGUAL | 0 refills | 7 days | Status: CP
Start: 2019-05-19 — End: 2019-05-25

## 2019-05-25 MED ORDER — BUPRENORPHINE 8 MG-NALOXONE 2 MG SUBLINGUAL FILM
ORAL_FILM | SUBLINGUAL | 0 refills | 7.00000 days | Status: CP
Start: 2019-05-25 — End: 2019-06-03

## 2019-06-01 MED ORDER — ATOMOXETINE 60 MG CAPSULE
ORAL_CAPSULE | Freq: Every day | ORAL | 0 refills | 30 days | Status: CP
Start: 2019-06-01 — End: 2019-07-01

## 2019-06-01 MED ORDER — LAMOTRIGINE 100 MG TABLET
ORAL_TABLET | Freq: Every day | ORAL | 0 refills | 30.00000 days | Status: CP
Start: 2019-06-01 — End: 2019-07-01

## 2019-06-01 MED ORDER — QUETIAPINE 25 MG TABLET
ORAL_TABLET | Freq: Every evening | ORAL | 0 refills | 30 days | Status: CP | PRN
Start: 2019-06-01 — End: 2019-07-01

## 2019-06-01 MED ORDER — VENLAFAXINE ER 75 MG CAPSULE,EXTENDED RELEASE 24 HR
ORAL_CAPSULE | Freq: Every day | ORAL | 0 refills | 30 days | Status: CP
Start: 2019-06-01 — End: 2019-07-01

## 2019-06-01 MED ORDER — BUPROPION HCL XL 150 MG 24 HR TABLET, EXTENDED RELEASE
ORAL_TABLET | Freq: Every day | ORAL | 0 refills | 30.00000 days | Status: CP
Start: 2019-06-01 — End: 2019-07-01

## 2019-06-03 MED ORDER — BUPRENORPHINE 8 MG-NALOXONE 2 MG SUBLINGUAL FILM
ORAL_FILM | SUBLINGUAL | 0 refills | 6 days | Status: CP
Start: 2019-06-03 — End: 2019-06-09

## 2019-06-30 ENCOUNTER — Ambulatory Visit: Admit: 2019-06-30 | Discharge: 2019-06-30 | Discharge status: Against Medical Advice | Payer: MEDICAID

## 2019-06-30 DIAGNOSIS — F319 Bipolar disorder, unspecified: Secondary | ICD-10-CM

## 2019-06-30 DIAGNOSIS — K029 Dental caries, unspecified: Secondary | ICD-10-CM

## 2019-06-30 DIAGNOSIS — F17210 Nicotine dependence, cigarettes, uncomplicated: Secondary | ICD-10-CM

## 2019-06-30 DIAGNOSIS — S025XXB Fracture of tooth (traumatic), initial encounter for open fracture: Secondary | ICD-10-CM

## 2019-06-30 DIAGNOSIS — Z91040 Latex allergy status: Secondary | ICD-10-CM

## 2019-06-30 DIAGNOSIS — F4310 Post-traumatic stress disorder, unspecified: Secondary | ICD-10-CM

## 2019-06-30 DIAGNOSIS — F1910 Other psychoactive substance abuse, uncomplicated: Secondary | ICD-10-CM

## 2019-06-30 DIAGNOSIS — R51 Headache: Secondary | ICD-10-CM

## 2019-06-30 DIAGNOSIS — Z5329 Procedure and treatment not carried out because of patient's decision for other reasons: Secondary | ICD-10-CM

## 2019-06-30 DIAGNOSIS — R220 Localized swelling, mass and lump, head: Secondary | ICD-10-CM

## 2019-07-06 DIAGNOSIS — F329 Major depressive disorder, single episode, unspecified: Secondary | ICD-10-CM

## 2019-07-06 DIAGNOSIS — F4310 Post-traumatic stress disorder, unspecified: Secondary | ICD-10-CM

## 2019-07-06 DIAGNOSIS — F419 Anxiety disorder, unspecified: Secondary | ICD-10-CM

## 2019-07-06 DIAGNOSIS — F902 Attention-deficit hyperactivity disorder, combined type: Secondary | ICD-10-CM

## 2019-07-06 DIAGNOSIS — Z720 Tobacco use: Secondary | ICD-10-CM

## 2019-07-06 DIAGNOSIS — G4700 Insomnia, unspecified: Secondary | ICD-10-CM

## 2019-07-06 MED ORDER — ATOMOXETINE 60 MG CAPSULE
ORAL_CAPSULE | Freq: Every day | ORAL | 0 refills | 30.00000 days | Status: CP
Start: 2019-07-06 — End: 2019-08-05

## 2019-07-06 MED ORDER — LAMOTRIGINE 100 MG TABLET
ORAL_TABLET | Freq: Every day | ORAL | 0 refills | 30 days | Status: CP
Start: 2019-07-06 — End: 2019-08-05

## 2019-07-06 MED ORDER — VENLAFAXINE ER 75 MG CAPSULE,EXTENDED RELEASE 24 HR
ORAL_CAPSULE | Freq: Every day | ORAL | 0 refills | 30 days | Status: CP
Start: 2019-07-06 — End: 2019-08-05

## 2019-07-06 MED ORDER — QUETIAPINE 25 MG TABLET
ORAL_TABLET | Freq: Every evening | ORAL | 0 refills | 30 days | Status: CP | PRN
Start: 2019-07-06 — End: 2019-08-05

## 2019-07-06 MED ORDER — BUPROPION HCL XL 150 MG 24 HR TABLET, EXTENDED RELEASE
ORAL_TABLET | Freq: Every day | ORAL | 0 refills | 30 days | Status: CP
Start: 2019-07-06 — End: 2019-08-05

## 2019-07-16 ENCOUNTER — Emergency Department
Admission: EM | Admit: 2019-07-16 | Discharge: 2019-07-16 | Discharge status: Against Medical Advice | Payer: Medicaid Other | Attending: Emergency Medicine | Admitting: Emergency Medicine

## 2019-07-16 DIAGNOSIS — F1193 Opioid use, unspecified with withdrawal: Secondary | ICD-10-CM

## 2019-07-16 DIAGNOSIS — F17210 Nicotine dependence, cigarettes, uncomplicated: Secondary | ICD-10-CM | POA: Insufficient documentation

## 2019-07-16 DIAGNOSIS — F1123 Opioid dependence with withdrawal: Secondary | ICD-10-CM | POA: Diagnosis not present

## 2019-07-16 MED ORDER — HALOPERIDOL LACTATE 5 MG/ML IJ SOLN
5.0000 mg | Freq: Once | INTRAMUSCULAR | Status: AC
  Administered 2019-07-16: 16:00:00 5 mg via INTRAMUSCULAR

## 2019-07-16 MED ORDER — HALOPERIDOL LACTATE 5 MG/ML IJ SOLN
INTRAMUSCULAR | Status: AC
  Administered 2019-07-16: 5 mg via INTRAMUSCULAR
  Filled 2019-07-16: qty 1

## 2019-07-16 MED ORDER — DIPHENHYDRAMINE HCL 50 MG/ML IJ SOLN
INTRAMUSCULAR | Status: AC
  Filled 2019-07-16: qty 1

## 2019-07-16 MED ORDER — LORAZEPAM 2 MG/ML IJ SOLN
2.0000 mg | Freq: Once | INTRAMUSCULAR | Status: AC
  Administered 2019-07-16: 16:00:00 2 mg via INTRAMUSCULAR

## 2019-07-16 MED ORDER — CLONIDINE HCL 0.1 MG PO TABS
0.2000 mg | ORAL_TABLET | Freq: Once | ORAL | Status: AC
Start: 2019-07-16 — End: 2019-07-16
  Administered 2019-07-16: 0.2 mg via ORAL
  Filled 2019-07-16: qty 2

## 2019-07-16 MED ORDER — DIPHENHYDRAMINE HCL 50 MG/ML IJ SOLN
50.0000 mg | Freq: Once | INTRAMUSCULAR | Status: AC
  Administered 2019-07-16: 50 mg via INTRAMUSCULAR

## 2019-07-16 MED ORDER — LORAZEPAM 2 MG/ML IJ SOLN
INTRAMUSCULAR | Status: AC
  Administered 2019-07-16: 2 mg via INTRAMUSCULAR
  Filled 2019-07-16: qty 1

## 2019-07-16 NOTE — ED Notes (Signed)
Pt continues pacing in room, lying down, getting up again. Yelling that she needs pain medications and "something to knock me the fuck out." Pt states she wants to get her phone back and go home because she has kids. EDP Archie Balboa notified pt wants to leave. Pt is voluntary at this time. EDP states he will reassess pt.

## 2019-07-16 NOTE — ED Notes (Addendum)
Pt stated she was hungry, was given ice water and refused sandwich tray.

## 2019-07-16 NOTE — ED Notes (Signed)
Pt attempting to walk out of unit. Redirected back to room to wait for EDP.

## 2019-07-16 NOTE — ED Provider Notes (Signed)
[facility] Emergency Department Provider Note  ____________________________________________   I have reviewed the triage vital signs and the nursing notes.   HISTORY  Chief Complaint Drug Overdose   History limited by: Patient yelling, not answering all questions   HPI Whitney Rollins is a 33 y.o. female who presents to the emergency department today with primary complaint of acute opioid withdrawal. The patient states that she has been doing heroin for the past couple of days. Then took a suboxone today and thinks that it caused withdrawal. She says that she has been prescribed suboxone but she had not taken it in a long time.   Records reviewed. Per medical record review patient has a history of opioid dependence  History reviewed. No pertinent past medical history.  There are no active problems to display for this patient.   History reviewed. No pertinent surgical history.  Prior to Admission medications   Not on File    Allergies Patient has no allergy information on record.  History reviewed. No pertinent family history.  Social History Social History   Tobacco Use  . Smoking status: Current Every Day Smoker    Packs/day: 2.00    Types: Cigarettes  Substance Use Topics  . Alcohol use: Not on file  . Drug use: Not on file    Review of Systems Limited secondary to patient uncooperativeness Constitutional: No fever/chills Cardiovascular: Denies chest pain. Respiratory: Denies shortness of breath. Gastrointestinal: Positive for abdominal pain Musculoskeletal: Positive for abdominal pain ____________________________________________   PHYSICAL EXAM:  VITAL SIGNS: ED Triage Vitals  Enc Vitals Group     BP 07/16/19 1530 (!) 141/101     Pulse Rate 07/16/19 1530 94     Resp 07/16/19 1530 (!) 28     Temp --      Temp Source 07/16/19 1530 Oral     SpO2 07/16/19 1530 99 %     Weight 07/16/19 1539 165 lb (74.8 kg)     Height  07/16/19 1539 5\' 6"  (1.676 m)   Constitutional: Alert and oriented.  Eyes: Conjunctivae are normal.  ENT      Head: Normocephalic and atraumatic.      Nose: No congestion/rhinnorhea.      Mouth/Throat: Mucous membranes are moist.      Neck: No stridor. Cardiovascular: Normal rate Respiratory: Tachypnea Genitourinary: Deferred Musculoskeletal: Moving all extremities. Neurologic:  Awake, alert, yelling Psychiatric: Yelling, upset  ____________________________________________    LABS (pertinent positives/negatives)  None  ____________________________________________   EKG  None  ____________________________________________    RADIOLOGY  None  ____________________________________________   PROCEDURES  Procedures  ____________________________________________   INITIAL IMPRESSION / ASSESSMENT AND PLAN / ED COURSE  Pertinent labs & imaging results that were available during my care of the patient were reviewed by me and considered in my medical decision making (see chart for details).   Patient presented to the emergency department today because of concerns for opioid withdrawal.  On exam patient was initially quite upset, yelling, hard to redirect.  Exam and history was extremely limited.  Did try to give patient medications to help with symptoms and to help patient gather some relief and rest.  Patient apparently stated that she wanted to leave and left the department prior to my ability to talk to her about risk or benefit. ____________________________________________   FINAL CLINICAL IMPRESSION(S) / ED DIAGNOSES  Final diagnoses:  Opioid withdrawal ([place])     Note: This dictation was prepared with Dragon dictation. Any transcriptional  errors that result from this process are unintentional     Phineas Semen, MD 07/16/19 [phone]

## 2019-07-16 NOTE — ED Notes (Signed)
Pt continues to be agitated and combative. Pt not cooperating with getting VS or staying in the bed. Pt will not answer questions. Pt thrashing in the bed. Keeps trying to climb out of bed despite being verbally redirected continually.

## 2019-07-16 NOTE — ED Notes (Signed)
Unable to collect blood from pt at this time d/t continuous thrashing around and danger of accidental needle stick for staff.

## 2019-07-16 NOTE — ED Notes (Signed)
Pt transferred to room 20. Cell phone place in bag with pt's name.

## 2019-07-16 NOTE — ED Notes (Addendum)
Pt still wanting to leave but staying in room at this time. EDP notified.   Per MD, pt appears to be more lucid now than on arrival and he does not plan to IVC her.

## 2019-07-16 NOTE — ED Notes (Signed)
Charge RN aware of pt's voluntary status and desire to leave. Pt waiting but remains agitated at the wait.

## 2019-07-16 NOTE — ED Notes (Signed)
This tech providing 15 min checks, pt still thrashing and beating on wall.

## 2019-07-16 NOTE — ED Triage Notes (Signed)
Pt found by EMS and PD acting agitated and violent. Pt continues to scream and yell and shake. Pt states she is withdrawing from heroin last use 2 days ago. Took ciboxin today.

## 2019-07-16 NOTE — ED Notes (Signed)
Pt continues to thrash and kick. Pt constantly verbally redirected

## 2019-07-16 NOTE — ED Notes (Addendum)
Pt signing out AMA at this time. Charge RN Theadora Rama and [employer] aware. Pt given paper scrub top and socks per request. Pt calling a ride on her cell phone.

## 2019-07-16 NOTE — ED Notes (Signed)
Pt continues to thrash and kick. Pt appears sleepy from meds but then will scream and thrash.

## 2019-07-16 NOTE — ED Notes (Signed)
Pt walking toward exit, redirected back to room to wait for EDP. Pt agitated but compliant at this time.

## 2019-07-16 NOTE — ED Notes (Signed)
ED Provider at bedside. 

## 2019-07-16 NOTE — ED Notes (Signed)
Pt visualized leaving ED with steady gait, security directing pt to exit.

## 2019-09-06 ENCOUNTER — Ambulatory Visit
Admit: 2019-09-06 | Discharge: 2019-09-07 | Payer: MEDICAID | Attending: Student in an Organized Health Care Education/Training Program | Primary: Student in an Organized Health Care Education/Training Program

## 2019-09-06 DIAGNOSIS — L700 Acne vulgaris: Principal | ICD-10-CM

## 2019-09-06 DIAGNOSIS — F419 Anxiety disorder, unspecified: Principal | ICD-10-CM

## 2019-09-06 DIAGNOSIS — N898 Other specified noninflammatory disorders of vagina: Principal | ICD-10-CM

## 2019-09-06 DIAGNOSIS — G4700 Insomnia, unspecified: Principal | ICD-10-CM

## 2019-09-06 DIAGNOSIS — F1110 Opioid abuse, uncomplicated: Principal | ICD-10-CM

## 2019-09-06 MED ORDER — DOXYCYCLINE HYCLATE 100 MG CAPSULE
ORAL_CAPSULE | Freq: Every evening | ORAL | 2 refills | 30.00000 days | Status: CP
Start: 2019-09-06 — End: 2019-12-05

## 2019-09-06 MED ORDER — TRETINOIN 0.01 % TOPICAL GEL
Freq: Every evening | TOPICAL | 2 refills | 0.00000 days | Status: CP
Start: 2019-09-06 — End: 2020-09-05

## 2019-09-06 MED ORDER — GABAPENTIN 300 MG CAPSULE
ORAL_CAPSULE | Freq: Every evening | ORAL | 12 refills | 90.00000 days | Status: CP
Start: 2019-09-06 — End: 2020-09-05

## 2019-09-06 MED ORDER — LEVONORGESTREL 0.15 MG-ETHINYL ESTRADIOL 0.03 MG TABLET
ORAL_TABLET | Freq: Every day | ORAL | 3 refills | 84 days | Status: CP
Start: 2019-09-06 — End: 2020-09-05

## 2019-09-06 MED ORDER — QUETIAPINE 25 MG TABLET
ORAL_TABLET | Freq: Every evening | ORAL | 0 refills | 30.00000 days | Status: CP | PRN
Start: 2019-09-06 — End: 2019-10-06

## 2019-10-25 DIAGNOSIS — G4700 Insomnia, unspecified: Principal | ICD-10-CM

## 2019-10-25 DIAGNOSIS — F419 Anxiety disorder, unspecified: Principal | ICD-10-CM

## 2019-10-25 MED ORDER — QUETIAPINE 25 MG TABLET
ORAL_TABLET | Freq: Every evening | ORAL | 1 refills | 30 days | Status: CP | PRN
Start: 2019-10-25 — End: 2019-11-24

## 2019-11-10 ENCOUNTER — Ambulatory Visit: Admit: 2019-11-10 | Discharge: 2019-11-10 | Disposition: A | Discharge status: Home / Self Care | Payer: MEDICAID

## 2019-11-10 DIAGNOSIS — K0889 Other specified disorders of teeth and supporting structures: Principal | ICD-10-CM

## 2019-11-10 MED ORDER — CLINDAMYCIN HCL 150 MG CAPSULE
ORAL_CAPSULE | Freq: Three times a day (TID) | ORAL | 0 refills | 7.00000 days | Status: CP
Start: 2019-11-10 — End: 2019-11-17

## 2020-01-05 DIAGNOSIS — Z8619 Personal history of other infectious and parasitic diseases: Principal | ICD-10-CM

## 2020-01-05 DIAGNOSIS — Z5181 Encounter for therapeutic drug level monitoring: Principal | ICD-10-CM

## 2020-01-08 ENCOUNTER — Ambulatory Visit
Admit: 2020-01-08 | Discharge: 2020-01-09 | Payer: MEDICAID | Attending: Student in an Organized Health Care Education/Training Program | Primary: Student in an Organized Health Care Education/Training Program

## 2020-01-08 DIAGNOSIS — F411 Generalized anxiety disorder: Principal | ICD-10-CM

## 2020-01-08 MED ORDER — GABAPENTIN 300 MG CAPSULE
ORAL_CAPSULE | Freq: Two times a day (BID) | ORAL | 0 refills | 30 days | Status: CP
Start: 2020-01-08 — End: 2020-02-07

## 2020-01-11 ENCOUNTER — Institutional Professional Consult (permissible substitution): Admit: 2020-01-11 | Discharge: 2020-01-12 | Payer: MEDICAID | Attending: Family | Primary: Family

## 2020-01-24 ENCOUNTER — Ambulatory Visit: Admit: 2020-01-24 | Discharge: 2020-01-25 | Payer: MEDICAID

## 2020-01-24 DIAGNOSIS — G4700 Insomnia, unspecified: Principal | ICD-10-CM

## 2020-01-24 DIAGNOSIS — F39 Unspecified mood [affective] disorder: Principal | ICD-10-CM

## 2020-01-24 DIAGNOSIS — F329 Major depressive disorder, single episode, unspecified: Principal | ICD-10-CM

## 2020-01-24 DIAGNOSIS — F411 Generalized anxiety disorder: Principal | ICD-10-CM

## 2020-01-24 DIAGNOSIS — F69 Unspecified disorder of adult personality and behavior: Principal | ICD-10-CM

## 2020-01-24 MED ORDER — QUETIAPINE 25 MG TABLET
ORAL_TABLET | ORAL | 0 refills | 0.00000 days | Status: CP
Start: 2020-01-24 — End: 2020-01-24

## 2020-01-24 MED ORDER — QUETIAPINE 25 MG TABLET: tablet | 0 refills | 0 days | Status: AC

## 2020-01-24 MED ORDER — VENLAFAXINE ER 37.5 MG CAPSULE,EXTENDED RELEASE 24 HR
ORAL_CAPSULE | Freq: Every day | ORAL | 2 refills | 60.00000 days | Status: CP
Start: 2020-01-24 — End: 2021-01-23

## 2020-01-24 MED ORDER — VENLAFAXINE ER 37.5 MG CAPSULE,EXTENDED RELEASE 24 HR: 38 mg | capsule | Freq: Every day | 2 refills | 60 days | Status: AC

## 2020-07-31 ENCOUNTER — Ambulatory Visit
Admit: 2020-07-31 | Payer: MEDICAID | Attending: Student in an Organized Health Care Education/Training Program | Primary: Student in an Organized Health Care Education/Training Program

## 2020-08-01 ENCOUNTER — Ambulatory Visit: Admit: 2020-08-01 | Discharge: 2020-08-02 | Payer: MEDICAID

## 2020-08-01 DIAGNOSIS — N3001 Acute cystitis with hematuria: Principal | ICD-10-CM

## 2020-08-01 DIAGNOSIS — G4700 Insomnia, unspecified: Principal | ICD-10-CM

## 2020-08-01 DIAGNOSIS — N760 Acute vaginitis: Principal | ICD-10-CM

## 2020-08-01 DIAGNOSIS — F411 Generalized anxiety disorder: Principal | ICD-10-CM

## 2020-08-01 DIAGNOSIS — F32A Depression, unspecified depression type: Principal | ICD-10-CM

## 2020-08-01 DIAGNOSIS — R300 Dysuria: Principal | ICD-10-CM

## 2020-08-01 DIAGNOSIS — B9689 Other specified bacterial agents as the cause of diseases classified elsewhere: Principal | ICD-10-CM

## 2020-08-01 DIAGNOSIS — G2581 Restless legs syndrome: Principal | ICD-10-CM

## 2020-08-01 DIAGNOSIS — F4310 Post-traumatic stress disorder, unspecified: Principal | ICD-10-CM

## 2020-08-01 DIAGNOSIS — F39 Unspecified mood [affective] disorder: Principal | ICD-10-CM

## 2020-08-01 MED ORDER — METRONIDAZOLE 0.75 % VAGINAL GEL
Freq: Every day | VAGINAL | 0 refills | 5.00000 days | Status: CP
Start: 2020-08-01 — End: 2020-08-06

## 2020-08-01 MED ORDER — NITROFURANTOIN MONOHYDRATE/MACROCRYSTALS 100 MG CAPSULE
ORAL_CAPSULE | Freq: Two times a day (BID) | ORAL | 0 refills | 5.00000 days | Status: CP
Start: 2020-08-01 — End: 2020-08-06

## 2020-08-01 MED ORDER — QUETIAPINE 25 MG TABLET
ORAL_TABLET | 0 refills | 0 days | Status: CP
Start: 2020-08-01 — End: ?

## 2020-08-01 MED ORDER — VENLAFAXINE ER 37.5 MG CAPSULE,EXTENDED RELEASE 24 HR
ORAL_CAPSULE | Freq: Every day | ORAL | 2 refills | 30 days | Status: CP
Start: 2020-08-01 — End: 2021-08-01

## 2020-08-01 MED ORDER — GABAPENTIN 300 MG CAPSULE
ORAL_CAPSULE | Freq: Every evening | ORAL | 0 refills | 30 days | Status: CP
Start: 2020-08-01 — End: 2020-08-31

## 2020-08-02 DIAGNOSIS — F39 Unspecified mood [affective] disorder: Principal | ICD-10-CM

## 2020-08-15 ENCOUNTER — Ambulatory Visit: Admit: 2020-08-15 | Discharge: 2020-08-16 | Payer: MEDICAID

## 2020-08-27 DIAGNOSIS — G2581 Restless legs syndrome: Principal | ICD-10-CM

## 2020-08-27 DIAGNOSIS — G4700 Insomnia, unspecified: Principal | ICD-10-CM

## 2020-08-27 DIAGNOSIS — F411 Generalized anxiety disorder: Principal | ICD-10-CM

## 2020-08-27 DIAGNOSIS — F39 Unspecified mood [affective] disorder: Principal | ICD-10-CM

## 2020-08-27 DIAGNOSIS — F32A Depression, unspecified depression type: Principal | ICD-10-CM

## 2020-08-27 MED ORDER — QUETIAPINE 25 MG TABLET
ORAL_TABLET | 0 refills | 0 days
Start: 2020-08-27 — End: ?

## 2020-08-27 MED ORDER — VENLAFAXINE ER 37.5 MG CAPSULE,EXTENDED RELEASE 24 HR
ORAL_CAPSULE | Freq: Every day | ORAL | 2 refills | 30 days
Start: 2020-08-27 — End: 2021-08-27

## 2020-08-28 MED ORDER — GABAPENTIN 300 MG CAPSULE
ORAL_CAPSULE | Freq: Every evening | ORAL | 0 refills | 90 days
Start: 2020-08-28 — End: 2020-09-27

## 2020-08-28 MED ORDER — QUETIAPINE 25 MG TABLET
ORAL_TABLET | 0 refills | 0 days
Start: 2020-08-28 — End: ?

## 2020-08-28 MED ORDER — VENLAFAXINE ER 37.5 MG CAPSULE,EXTENDED RELEASE 24 HR
ORAL_CAPSULE | Freq: Every day | ORAL | 2 refills | 30 days
Start: 2020-08-28 — End: 2021-08-28

## 2020-09-03 ENCOUNTER — Ambulatory Visit: Admit: 2020-09-03 | Discharge: 2020-09-04 | Payer: MEDICAID

## 2020-09-03 DIAGNOSIS — F32A Depression, unspecified depression type: Principal | ICD-10-CM

## 2020-09-03 DIAGNOSIS — F39 Unspecified mood [affective] disorder: Principal | ICD-10-CM

## 2020-09-03 DIAGNOSIS — G2581 Restless legs syndrome: Principal | ICD-10-CM

## 2020-09-03 DIAGNOSIS — H60501 Unspecified acute noninfective otitis externa, right ear: Principal | ICD-10-CM

## 2020-09-03 DIAGNOSIS — G4700 Insomnia, unspecified: Principal | ICD-10-CM

## 2020-09-03 DIAGNOSIS — F411 Generalized anxiety disorder: Principal | ICD-10-CM

## 2020-09-03 MED ORDER — GABAPENTIN 300 MG CAPSULE
ORAL_CAPSULE | Freq: Every evening | ORAL | 2 refills | 30.00000 days | Status: CP
Start: 2020-09-03 — End: 2020-10-01

## 2020-09-03 MED ORDER — VENLAFAXINE ER 37.5 MG CAPSULE,EXTENDED RELEASE 24 HR
ORAL_CAPSULE | 2 refills | 0 days | Status: CP
Start: 2020-09-03 — End: 2020-09-05

## 2020-09-03 MED ORDER — QUETIAPINE 25 MG TABLET
ORAL_TABLET | 2 refills | 0 days | Status: CP
Start: 2020-09-03 — End: 2020-10-01

## 2020-09-03 MED ORDER — OFLOXACIN 0.3 % EAR DROPS
Freq: Every day | OTIC | 0 refills | 20 days | Status: CP
Start: 2020-09-03 — End: 2020-09-10

## 2020-09-05 DIAGNOSIS — F32A Depression, unspecified depression type: Principal | ICD-10-CM

## 2020-09-05 DIAGNOSIS — F411 Generalized anxiety disorder: Principal | ICD-10-CM

## 2020-09-05 MED ORDER — VENLAFAXINE ER 75 MG CAPSULE,EXTENDED RELEASE 24 HR
ORAL_CAPSULE | Freq: Every day | ORAL | 2 refills | 30.00000 days | Status: CP
Start: 2020-09-05 — End: 2021-09-05

## 2020-09-05 MED ORDER — VENLAFAXINE ER 37.5 MG CAPSULE,EXTENDED RELEASE 24 HR: capsule | 0 refills | 0 days | Status: AC

## 2020-09-05 MED ORDER — VENLAFAXINE ER 37.5 MG CAPSULE,EXTENDED RELEASE 24 HR
ORAL_CAPSULE | 0 refills | 0.00000 days | Status: CP
Start: 2020-09-05 — End: 2020-10-01

## 2020-09-10 ENCOUNTER — Institutional Professional Consult (permissible substitution): Admit: 2020-09-10 | Discharge: 2020-09-11 | Payer: MEDICAID | Attending: Social Worker | Primary: Social Worker

## 2020-09-10 DIAGNOSIS — F39 Unspecified mood [affective] disorder: Principal | ICD-10-CM

## 2020-10-01 ENCOUNTER — Ambulatory Visit: Admit: 2020-10-01 | Discharge: 2020-10-02 | Payer: MEDICAID

## 2020-10-01 DIAGNOSIS — F39 Unspecified mood [affective] disorder: Principal | ICD-10-CM

## 2020-10-01 DIAGNOSIS — G2581 Restless legs syndrome: Principal | ICD-10-CM

## 2020-10-01 DIAGNOSIS — G4700 Insomnia, unspecified: Principal | ICD-10-CM

## 2020-10-01 MED ORDER — GABAPENTIN 300 MG CAPSULE
ORAL_CAPSULE | Freq: Every evening | ORAL | 3 refills | 30.00000 days | Status: CP
Start: 2020-10-01 — End: 2020-10-31

## 2020-10-01 MED ORDER — QUETIAPINE 25 MG TABLET
ORAL_TABLET | 3 refills | 0 days | Status: CP
Start: 2020-10-01 — End: ?

## 2020-10-01 MED ORDER — CITALOPRAM 10 MG TABLET
ORAL_TABLET | Freq: Every day | ORAL | 3 refills | 30.00000 days | Status: CP
Start: 2020-10-01 — End: 2021-10-01

## 2020-11-13 ENCOUNTER — Ambulatory Visit: Admit: 2020-11-13 | Discharge: 2020-11-13 | Disposition: A | Discharge status: Home / Self Care | Payer: MEDICAID

## 2020-11-13 DIAGNOSIS — T50901A Poisoning by unspecified drugs, medicaments and biological substances, accidental (unintentional), initial encounter: Principal | ICD-10-CM

## 2020-11-13 DIAGNOSIS — Z833 Family history of diabetes mellitus: Principal | ICD-10-CM

## 2020-11-13 DIAGNOSIS — F4310 Post-traumatic stress disorder, unspecified: Principal | ICD-10-CM

## 2020-11-13 DIAGNOSIS — Z832 Family history of diseases of the blood and blood-forming organs and certain disorders involving the immune mechanism: Principal | ICD-10-CM

## 2020-11-13 DIAGNOSIS — Z825 Family history of asthma and other chronic lower respiratory diseases: Principal | ICD-10-CM

## 2020-11-13 DIAGNOSIS — F1910 Other psychoactive substance abuse, uncomplicated: Principal | ICD-10-CM

## 2020-11-13 DIAGNOSIS — T40601A Poisoning by unspecified narcotics, accidental (unintentional), initial encounter: Principal | ICD-10-CM

## 2020-11-13 DIAGNOSIS — Z8619 Personal history of other infectious and parasitic diseases: Principal | ICD-10-CM

## 2020-11-13 DIAGNOSIS — T40601S Poisoning by unspecified narcotics, accidental (unintentional), sequela: Principal | ICD-10-CM

## 2020-11-13 DIAGNOSIS — F319 Bipolar disorder, unspecified: Principal | ICD-10-CM

## 2020-11-13 DIAGNOSIS — Z820 Family history of epilepsy and other diseases of the nervous system: Principal | ICD-10-CM

## 2020-11-13 MED ORDER — NALOXONE 1 MG/ML INTRANASAL SOLUTION
Freq: Once | NASAL | 0 refills | 0 days | Status: CP | PRN
Start: 2020-11-13 — End: ?

## 2020-11-25 ENCOUNTER — Ambulatory Visit: Admit: 2020-11-25 | Discharge: 2020-11-25 | Disposition: A | Discharge status: Home / Self Care | Payer: MEDICAID

## 2020-11-25 DIAGNOSIS — Z91040 Latex allergy status: Principal | ICD-10-CM

## 2020-11-25 DIAGNOSIS — Z20822 Exposure to COVID-19 virus: Principal | ICD-10-CM

## 2020-11-25 DIAGNOSIS — R6883 Chills (without fever): Principal | ICD-10-CM

## 2020-11-25 DIAGNOSIS — R112 Nausea with vomiting, unspecified: Principal | ICD-10-CM

## 2020-11-25 DIAGNOSIS — R059 Cough, unspecified: Principal | ICD-10-CM

## 2020-11-25 DIAGNOSIS — R519 Headache, unspecified: Principal | ICD-10-CM

## 2020-11-25 MED ORDER — ONDANSETRON 4 MG DISINTEGRATING TABLET
ORAL_TABLET | Freq: Three times a day (TID) | ORAL | 0 refills | 4.00000 days | Status: CP | PRN
Start: 2020-11-25 — End: 2020-11-25

## 2021-01-12 ENCOUNTER — Ambulatory Visit: Admit: 2021-01-12 | Discharge: 2021-01-12 | Disposition: A | Discharge status: Home / Self Care | Payer: MEDICAID

## 2021-01-12 ENCOUNTER — Emergency Department: Admit: 2021-01-12 | Discharge: 2021-01-12 | Disposition: A | Discharge status: Home / Self Care | Payer: MEDICAID

## 2021-01-12 DIAGNOSIS — N760 Acute vaginitis: Principal | ICD-10-CM

## 2021-01-12 DIAGNOSIS — S0990XA Unspecified injury of head, initial encounter: Principal | ICD-10-CM

## 2021-01-12 MED ORDER — METRONIDAZOLE 0.75 % VAGINAL GEL
Freq: Two times a day (BID) | VAGINAL | 0 refills | 7.00000 days | Status: CP
Start: 2021-01-12 — End: 2021-01-19

## 2021-02-26 ENCOUNTER — Ambulatory Visit
Admit: 2021-02-26 | Discharge: 2021-02-27 | Payer: MEDICAID | Attending: Student in an Organized Health Care Education/Training Program | Primary: Student in an Organized Health Care Education/Training Program

## 2021-02-26 DIAGNOSIS — N898 Other specified noninflammatory disorders of vagina: Principal | ICD-10-CM

## 2021-02-26 DIAGNOSIS — H9211 Otorrhea, right ear: Principal | ICD-10-CM

## 2021-02-26 DIAGNOSIS — G2581 Restless legs syndrome: Principal | ICD-10-CM

## 2021-02-26 DIAGNOSIS — Z113 Encounter for screening for infections with a predominantly sexual mode of transmission: Principal | ICD-10-CM

## 2021-02-26 DIAGNOSIS — F39 Unspecified mood [affective] disorder: Principal | ICD-10-CM

## 2021-02-26 DIAGNOSIS — G4700 Insomnia, unspecified: Principal | ICD-10-CM

## 2021-02-26 MED ORDER — CIPROFLOXACIN 0.3 %-DEXAMETHASONE 0.1 % EAR DROPS,SUSPENSION
Freq: Two times a day (BID) | OTIC | 0 refills | 7 days | Status: CP
Start: 2021-02-26 — End: 2021-03-05

## 2021-02-26 MED ORDER — QUETIAPINE 25 MG TABLET
ORAL_TABLET | 3 refills | 0 days | Status: CP
Start: 2021-02-26 — End: ?

## 2021-02-26 MED ORDER — GABAPENTIN 300 MG CAPSULE
ORAL_CAPSULE | Freq: Every evening | ORAL | 3 refills | 15.00000 days | Status: CP
Start: 2021-02-26 — End: ?

## 2021-02-27 DIAGNOSIS — A749 Chlamydial infection, unspecified: Principal | ICD-10-CM

## 2021-02-27 MED ORDER — DOXYCYCLINE HYCLATE 100 MG TABLET
ORAL_TABLET | Freq: Two times a day (BID) | ORAL | 0 refills | 7 days | Status: CP
Start: 2021-02-27 — End: 2021-03-06

## 2021-03-18 DIAGNOSIS — A749 Chlamydial infection, unspecified: Principal | ICD-10-CM

## 2021-03-18 MED ORDER — LEVOFLOXACIN 500 MG TABLET
ORAL_TABLET | Freq: Every day | ORAL | 0 refills | 7 days | Status: CP
Start: 2021-03-18 — End: 2021-03-25

## 2021-04-07 DIAGNOSIS — F32A Depression, unspecified depression type: Principal | ICD-10-CM

## 2021-04-07 DIAGNOSIS — F411 Generalized anxiety disorder: Principal | ICD-10-CM

## 2021-04-08 MED ORDER — VENLAFAXINE ER 37.5 MG CAPSULE,EXTENDED RELEASE 24 HR
ORAL_CAPSULE | 0 refills | 0 days
Start: 2021-04-08 — End: ?

## 2021-05-02 ENCOUNTER — Ambulatory Visit: Admit: 2021-05-02 | Payer: MEDICAID

## 2021-07-24 DIAGNOSIS — G2581 Restless legs syndrome: Principal | ICD-10-CM

## 2021-07-24 DIAGNOSIS — G4700 Insomnia, unspecified: Principal | ICD-10-CM

## 2021-07-24 MED ORDER — GABAPENTIN 300 MG CAPSULE
ORAL_CAPSULE | Freq: Every evening | ORAL | 11 refills | 30.00000 days | Status: CP
Start: 2021-07-24 — End: 2022-07-24

## 2021-08-14 ENCOUNTER — Institutional Professional Consult (permissible substitution): Admit: 2021-08-14 | Discharge: 2021-08-15 | Payer: MEDICAID

## 2021-08-14 DIAGNOSIS — Z202 Contact with and (suspected) exposure to infections with a predominantly sexual mode of transmission: Principal | ICD-10-CM

## 2021-08-14 DIAGNOSIS — G2581 Restless legs syndrome: Principal | ICD-10-CM

## 2021-08-14 DIAGNOSIS — G4700 Insomnia, unspecified: Principal | ICD-10-CM

## 2021-08-14 MED ORDER — MIRTAZAPINE 15 MG TABLET
ORAL_TABLET | Freq: Every evening | ORAL | 2 refills | 30 days | Status: CP
Start: 2021-08-14 — End: 2022-08-14

## 2021-08-14 MED ORDER — AZITHROMYCIN 500 MG TABLET
ORAL_TABLET | Freq: Once | ORAL | 0 refills | 1 days | Status: CP
Start: 2021-08-14 — End: 2021-08-14

## 2021-08-14 MED ORDER — GABAPENTIN 300 MG CAPSULE
ORAL_CAPSULE | Freq: Every evening | ORAL | 5 refills | 30 days | Status: CP
Start: 2021-08-14 — End: 2022-08-14

## 2021-08-14 MED ORDER — CEFIXIME 400 MG CAPSULE
ORAL_CAPSULE | Freq: Once | ORAL | 0 refills | 1 days | Status: CP
Start: 2021-08-14 — End: 2021-08-14

## 2021-10-09 ENCOUNTER — Ambulatory Visit: Admit: 2021-10-09 | Discharge: 2021-10-10 | Disposition: A | Discharge status: Home / Self Care | Payer: MEDICAID

## 2021-10-10 DIAGNOSIS — R21 Rash and other nonspecific skin eruption: Principal | ICD-10-CM

## 2021-10-10 MED ORDER — AZITHROMYCIN 500 MG TABLET
ORAL_TABLET | Freq: Once | ORAL | 0 refills | 1 days | Status: CP
Start: 2021-10-10 — End: 2021-10-10

## 2021-10-10 MED ORDER — METRONIDAZOLE 0.75 % (37.5 MG/5 GRAM) VAGINAL GEL
Freq: Two times a day (BID) | VAGINAL | 0 refills | 7.00000 days | Status: CP
Start: 2021-10-10 — End: 2021-10-17

## 2021-10-10 MED ORDER — NITROFURANTOIN MONOHYDRATE/MACROCRYSTALS 100 MG CAPSULE
ORAL_CAPSULE | Freq: Two times a day (BID) | ORAL | 0 refills | 7 days | Status: CP
Start: 2021-10-10 — End: 2021-10-17

## 2021-12-06 ENCOUNTER — Ambulatory Visit: Admit: 2021-12-06 | Discharge: 2021-12-06 | Disposition: A | Discharge status: Home / Self Care | Payer: MEDICAID

## 2021-12-06 MED ORDER — HYDROXYZINE HCL 25 MG TABLET
ORAL_TABLET | Freq: Three times a day (TID) | ORAL | 0 refills | 7 days | Status: CP | PRN
Start: 2021-12-06 — End: ?

## 2021-12-24 ENCOUNTER — Ambulatory Visit
Admit: 2021-12-24 | Discharge: 2021-12-25 | Payer: MEDICAID | Attending: Student in an Organized Health Care Education/Training Program | Primary: Student in an Organized Health Care Education/Training Program

## 2021-12-24 MED ORDER — CLINDAMYCIN 1 % LOTION
3 refills | 0 days | Status: CP
Start: 2021-12-24 — End: ?

## 2021-12-24 MED ORDER — CLOBETASOL 0.05 % TOPICAL OINTMENT
5 refills | 0 days | Status: CP
Start: 2021-12-24 — End: ?

## 2021-12-30 ENCOUNTER — Ambulatory Visit: Admit: 2021-12-30 | Discharge: 2021-12-31 | Payer: MEDICAID

## 2021-12-30 DIAGNOSIS — G4700 Insomnia, unspecified: Principal | ICD-10-CM

## 2021-12-30 DIAGNOSIS — F419 Anxiety disorder, unspecified: Principal | ICD-10-CM

## 2021-12-30 DIAGNOSIS — N912 Amenorrhea, unspecified: Principal | ICD-10-CM

## 2021-12-30 DIAGNOSIS — Z124 Encounter for screening for malignant neoplasm of cervix: Principal | ICD-10-CM

## 2021-12-30 DIAGNOSIS — Z7251 High risk heterosexual behavior: Principal | ICD-10-CM

## 2021-12-30 DIAGNOSIS — L281 Prurigo nodularis: Principal | ICD-10-CM

## 2021-12-30 DIAGNOSIS — F4310 Post-traumatic stress disorder, unspecified: Principal | ICD-10-CM

## 2021-12-30 DIAGNOSIS — N898 Other specified noninflammatory disorders of vagina: Principal | ICD-10-CM

## 2021-12-30 DIAGNOSIS — F1910 Other psychoactive substance abuse, uncomplicated: Principal | ICD-10-CM

## 2021-12-30 MED ORDER — QUETIAPINE 25 MG TABLET
ORAL_TABLET | Freq: Every evening | ORAL | 11 refills | 30 days | Status: CP
Start: 2021-12-30 — End: 2022-01-29

## 2021-12-30 MED ORDER — FLUCONAZOLE 150 MG TABLET
ORAL_TABLET | Freq: Once | ORAL | 0 refills | 2 days | Status: CP
Start: 2021-12-30 — End: 2021-12-30

## 2021-12-30 MED ORDER — GABAPENTIN 300 MG CAPSULE
ORAL_CAPSULE | Freq: Every evening | ORAL | 5 refills | 30 days | Status: CP
Start: 2021-12-30 — End: 2022-12-30

## 2022-01-01 DIAGNOSIS — G4700 Insomnia, unspecified: Principal | ICD-10-CM

## 2022-01-01 DIAGNOSIS — N898 Other specified noninflammatory disorders of vagina: Principal | ICD-10-CM

## 2022-01-01 MED ORDER — METRONIDAZOLE 0.75 % (37.5 MG/5 GRAM) VAGINAL GEL
VAGINAL | 0 refills | 0.00000 days | Status: CP
Start: 2022-01-01 — End: 2022-01-01

## 2022-01-01 MED ORDER — QUETIAPINE 25 MG TABLET
ORAL_TABLET | Freq: Every evening | ORAL | 11 refills | 30.00000 days | Status: CP
Start: 2022-01-01 — End: 2022-01-01

## 2022-01-19 ENCOUNTER — Ambulatory Visit
Admit: 2022-01-19 | Discharge: 2022-01-19 | Disposition: A | Discharge status: Home / Self Care | Payer: MEDICAID | Attending: Student in an Organized Health Care Education/Training Program

## 2022-01-19 ENCOUNTER — Emergency Department
Admit: 2022-01-19 | Discharge: 2022-01-19 | Disposition: A | Discharge status: Home / Self Care | Payer: MEDICAID | Attending: Student in an Organized Health Care Education/Training Program

## 2022-01-19 DIAGNOSIS — N83202 Unspecified ovarian cyst, left side: Principal | ICD-10-CM

## 2022-01-19 DIAGNOSIS — N760 Acute vaginitis: Principal | ICD-10-CM

## 2022-01-19 DIAGNOSIS — B9689 Other specified bacterial agents as the cause of diseases classified elsewhere: Principal | ICD-10-CM

## 2022-01-19 MED ORDER — METRONIDAZOLE 500 MG TABLET
ORAL_TABLET | Freq: Two times a day (BID) | ORAL | 0 refills | 7 days | Status: CP
Start: 2022-01-19 — End: 2022-01-26

## 2022-01-20 MED ORDER — METRONIDAZOLE 0.75 % (37.5 MG/5 GRAM) VAGINAL GEL
Freq: Two times a day (BID) | VAGINAL | 0 refills | 7 days | Status: CP
Start: 2022-01-20 — End: 2022-01-27

## 2022-01-20 MED ORDER — AZITHROMYCIN 500 MG TABLET
ORAL_TABLET | Freq: Once | ORAL | 0 refills | 1 days | Status: CP
Start: 2022-01-20 — End: 2022-01-20

## 2022-01-24 MED ORDER — AZITHROMYCIN 500 MG TABLET
ORAL_TABLET | Freq: Once | ORAL | 0 refills | 1 days | Status: CP
Start: 2022-01-24 — End: 2022-01-24

## 2022-01-24 MED ORDER — METRONIDAZOLE 0.75 % (37.5 MG/5 GRAM) VAGINAL GEL
Freq: Two times a day (BID) | VAGINAL | 0 refills | 7 days | Status: CP
Start: 2022-01-24 — End: 2022-01-31

## 2022-03-05 ENCOUNTER — Ambulatory Visit: Admit: 2022-03-05 | Payer: MEDICAID

## 2022-12-29 ENCOUNTER — Ambulatory Visit
Admit: 2022-12-29 | Discharge: 2022-12-29 | Disposition: A | Discharge status: Home / Self Care | Payer: PRIVATE HEALTH INSURANCE | Attending: Emergency Medicine

## 2022-12-29 DIAGNOSIS — B9689 Other specified bacterial agents as the cause of diseases classified elsewhere: Principal | ICD-10-CM

## 2022-12-29 DIAGNOSIS — N760 Acute vaginitis: Principal | ICD-10-CM

## 2022-12-29 DIAGNOSIS — Z202 Contact with and (suspected) exposure to infections with a predominantly sexual mode of transmission: Principal | ICD-10-CM

## 2022-12-29 MED ORDER — METRONIDAZOLE 0.75 % (37.5 MG/5 GRAM) VAGINAL GEL
Freq: Two times a day (BID) | VAGINAL | 0 refills | 7 days | Status: CP
Start: 2022-12-29 — End: 2023-01-05

## 2022-12-29 MED ORDER — METRONIDAZOLE 500 MG TABLET
ORAL_TABLET | Freq: Two times a day (BID) | ORAL | 0 refills | 7 days | Status: CP
Start: 2022-12-29 — End: 2022-12-29

## 2023-02-09 ENCOUNTER — Ambulatory Visit: Admit: 2023-02-09

## 2023-12-06 ENCOUNTER — Ambulatory Visit: Admit: 2023-12-06

## 2024-08-17 ENCOUNTER — Emergency Department
Admit: 2024-08-17 | Discharge: 2024-08-17 | Disposition: A | Discharge status: Home / Self Care | Payer: Medicaid (Managed Care)

## 2024-08-17 MED ORDER — SULFAMETHOXAZOLE 800 MG-TRIMETHOPRIM 160 MG TABLET
ORAL_TABLET | Freq: Two times a day (BID) | ORAL | 0 refills | 10.00000 days | Status: CP
Start: 2024-08-17 — End: 2024-08-27
